# Patient Record
Sex: Male | Born: 1945
Health system: Southern US, Community
[De-identification: ages and names within clinical notes are randomized; demographics above are authoritative.]

## PROBLEM LIST (undated history)

## (undated) DIAGNOSIS — D170 Benign lipomatous neoplasm of skin and subcutaneous tissue of head, face and neck: Secondary | ICD-10-CM

## (undated) DIAGNOSIS — J449 Chronic obstructive pulmonary disease, unspecified: Secondary | ICD-10-CM

## (undated) DIAGNOSIS — C61 Malignant neoplasm of prostate: Secondary | ICD-10-CM

## (undated) DIAGNOSIS — Z87891 Personal history of nicotine dependence: Secondary | ICD-10-CM

## (undated) DIAGNOSIS — I2699 Other pulmonary embolism without acute cor pulmonale: Secondary | ICD-10-CM

## (undated) DIAGNOSIS — D509 Iron deficiency anemia, unspecified: Secondary | ICD-10-CM

## (undated) DIAGNOSIS — J9611 Chronic respiratory failure with hypoxia: Secondary | ICD-10-CM

## (undated) DIAGNOSIS — J302 Other seasonal allergic rhinitis: Secondary | ICD-10-CM

## (undated) DIAGNOSIS — K219 Gastro-esophageal reflux disease without esophagitis: Secondary | ICD-10-CM

## (undated) HISTORY — PX: CATARACT EXTRACTION: SUR2

---

## 2011-08-02 DIAGNOSIS — R339 Retention of urine, unspecified: Secondary | ICD-10-CM | POA: Diagnosis not present

## 2011-08-02 DIAGNOSIS — R0602 Shortness of breath: Secondary | ICD-10-CM | POA: Diagnosis not present

## 2011-08-02 DIAGNOSIS — N10 Acute tubulo-interstitial nephritis: Secondary | ICD-10-CM | POA: Diagnosis not present

## 2011-08-02 DIAGNOSIS — J441 Chronic obstructive pulmonary disease with (acute) exacerbation: Secondary | ICD-10-CM | POA: Diagnosis not present

## 2011-08-02 DIAGNOSIS — R062 Wheezing: Secondary | ICD-10-CM | POA: Diagnosis not present

## 2011-08-24 DIAGNOSIS — F17200 Nicotine dependence, unspecified, uncomplicated: Secondary | ICD-10-CM | POA: Diagnosis not present

## 2011-08-24 DIAGNOSIS — I059 Rheumatic mitral valve disease, unspecified: Secondary | ICD-10-CM | POA: Diagnosis not present

## 2011-08-24 DIAGNOSIS — R0989 Other specified symptoms and signs involving the circulatory and respiratory systems: Secondary | ICD-10-CM | POA: Diagnosis not present

## 2011-08-24 DIAGNOSIS — R0602 Shortness of breath: Secondary | ICD-10-CM | POA: Diagnosis not present

## 2011-08-24 DIAGNOSIS — J9600 Acute respiratory failure, unspecified whether with hypoxia or hypercapnia: Secondary | ICD-10-CM | POA: Diagnosis not present

## 2011-08-24 DIAGNOSIS — I2699 Other pulmonary embolism without acute cor pulmonale: Secondary | ICD-10-CM | POA: Diagnosis not present

## 2011-08-24 DIAGNOSIS — Z9981 Dependence on supplemental oxygen: Secondary | ICD-10-CM | POA: Diagnosis not present

## 2011-08-24 DIAGNOSIS — R000 Tachycardia, unspecified: Secondary | ICD-10-CM | POA: Diagnosis not present

## 2011-08-24 DIAGNOSIS — R0902 Hypoxemia: Secondary | ICD-10-CM | POA: Diagnosis not present

## 2011-08-24 DIAGNOSIS — J441 Chronic obstructive pulmonary disease with (acute) exacerbation: Secondary | ICD-10-CM | POA: Diagnosis not present

## 2011-08-24 DIAGNOSIS — R05 Cough: Secondary | ICD-10-CM | POA: Diagnosis not present

## 2011-08-24 DIAGNOSIS — Z8546 Personal history of malignant neoplasm of prostate: Secondary | ICD-10-CM | POA: Diagnosis not present

## 2011-08-25 DIAGNOSIS — I059 Rheumatic mitral valve disease, unspecified: Secondary | ICD-10-CM | POA: Diagnosis not present

## 2011-09-09 DIAGNOSIS — R634 Abnormal weight loss: Secondary | ICD-10-CM | POA: Diagnosis not present

## 2011-09-09 DIAGNOSIS — N390 Urinary tract infection, site not specified: Secondary | ICD-10-CM | POA: Diagnosis not present

## 2011-09-09 DIAGNOSIS — J438 Other emphysema: Secondary | ICD-10-CM | POA: Diagnosis not present

## 2011-09-09 DIAGNOSIS — F17200 Nicotine dependence, unspecified, uncomplicated: Secondary | ICD-10-CM | POA: Diagnosis not present

## 2011-09-09 DIAGNOSIS — R5381 Other malaise: Secondary | ICD-10-CM | POA: Diagnosis not present

## 2011-09-09 DIAGNOSIS — R5383 Other fatigue: Secondary | ICD-10-CM | POA: Diagnosis not present

## 2011-09-29 DIAGNOSIS — H2510 Age-related nuclear cataract, unspecified eye: Secondary | ICD-10-CM | POA: Diagnosis not present

## 2011-10-06 DIAGNOSIS — Z87891 Personal history of nicotine dependence: Secondary | ICD-10-CM | POA: Diagnosis not present

## 2011-10-06 DIAGNOSIS — H269 Unspecified cataract: Secondary | ICD-10-CM | POA: Diagnosis not present

## 2011-10-06 DIAGNOSIS — H2510 Age-related nuclear cataract, unspecified eye: Secondary | ICD-10-CM | POA: Diagnosis not present

## 2011-10-06 DIAGNOSIS — J449 Chronic obstructive pulmonary disease, unspecified: Secondary | ICD-10-CM | POA: Diagnosis not present

## 2011-10-06 DIAGNOSIS — Z8546 Personal history of malignant neoplasm of prostate: Secondary | ICD-10-CM | POA: Diagnosis not present

## 2011-10-09 DIAGNOSIS — J449 Chronic obstructive pulmonary disease, unspecified: Secondary | ICD-10-CM | POA: Diagnosis not present

## 2011-12-01 DIAGNOSIS — H2510 Age-related nuclear cataract, unspecified eye: Secondary | ICD-10-CM | POA: Diagnosis not present

## 2011-12-03 DIAGNOSIS — F17200 Nicotine dependence, unspecified, uncomplicated: Secondary | ICD-10-CM | POA: Diagnosis not present

## 2011-12-03 DIAGNOSIS — R5381 Other malaise: Secondary | ICD-10-CM | POA: Diagnosis not present

## 2011-12-03 DIAGNOSIS — R5383 Other fatigue: Secondary | ICD-10-CM | POA: Diagnosis not present

## 2011-12-03 DIAGNOSIS — C61 Malignant neoplasm of prostate: Secondary | ICD-10-CM | POA: Diagnosis not present

## 2011-12-03 DIAGNOSIS — N390 Urinary tract infection, site not specified: Secondary | ICD-10-CM | POA: Diagnosis not present

## 2011-12-03 DIAGNOSIS — R634 Abnormal weight loss: Secondary | ICD-10-CM | POA: Diagnosis not present

## 2011-12-03 DIAGNOSIS — J438 Other emphysema: Secondary | ICD-10-CM | POA: Diagnosis not present

## 2011-12-08 DIAGNOSIS — Z8546 Personal history of malignant neoplasm of prostate: Secondary | ICD-10-CM | POA: Diagnosis not present

## 2011-12-08 DIAGNOSIS — H269 Unspecified cataract: Secondary | ICD-10-CM | POA: Diagnosis not present

## 2011-12-08 DIAGNOSIS — J449 Chronic obstructive pulmonary disease, unspecified: Secondary | ICD-10-CM | POA: Diagnosis not present

## 2011-12-08 DIAGNOSIS — H2510 Age-related nuclear cataract, unspecified eye: Secondary | ICD-10-CM | POA: Diagnosis not present

## 2011-12-22 DIAGNOSIS — R300 Dysuria: Secondary | ICD-10-CM | POA: Diagnosis not present

## 2011-12-22 DIAGNOSIS — N390 Urinary tract infection, site not specified: Secondary | ICD-10-CM | POA: Diagnosis not present

## 2012-01-11 DIAGNOSIS — N401 Enlarged prostate with lower urinary tract symptoms: Secondary | ICD-10-CM | POA: Diagnosis not present

## 2012-01-11 DIAGNOSIS — R3129 Other microscopic hematuria: Secondary | ICD-10-CM | POA: Diagnosis not present

## 2012-01-11 DIAGNOSIS — N390 Urinary tract infection, site not specified: Secondary | ICD-10-CM | POA: Diagnosis not present

## 2012-01-11 DIAGNOSIS — R39198 Other difficulties with micturition: Secondary | ICD-10-CM | POA: Diagnosis not present

## 2012-01-28 DIAGNOSIS — N401 Enlarged prostate with lower urinary tract symptoms: Secondary | ICD-10-CM | POA: Diagnosis not present

## 2012-01-28 DIAGNOSIS — R3129 Other microscopic hematuria: Secondary | ICD-10-CM | POA: Diagnosis not present

## 2012-03-01 DIAGNOSIS — E782 Mixed hyperlipidemia: Secondary | ICD-10-CM | POA: Diagnosis not present

## 2012-03-01 DIAGNOSIS — E7800 Pure hypercholesterolemia, unspecified: Secondary | ICD-10-CM | POA: Diagnosis not present

## 2012-03-01 DIAGNOSIS — Z0000 Encounter for general adult medical examination without abnormal findings: Secondary | ICD-10-CM | POA: Diagnosis not present

## 2012-03-01 DIAGNOSIS — E559 Vitamin D deficiency, unspecified: Secondary | ICD-10-CM | POA: Diagnosis not present

## 2012-03-01 DIAGNOSIS — R5381 Other malaise: Secondary | ICD-10-CM | POA: Diagnosis not present

## 2012-03-08 DIAGNOSIS — J438 Other emphysema: Secondary | ICD-10-CM | POA: Diagnosis not present

## 2012-03-08 DIAGNOSIS — R000 Tachycardia, unspecified: Secondary | ICD-10-CM | POA: Diagnosis not present

## 2012-03-08 DIAGNOSIS — R3129 Other microscopic hematuria: Secondary | ICD-10-CM | POA: Diagnosis not present

## 2012-03-08 DIAGNOSIS — C61 Malignant neoplasm of prostate: Secondary | ICD-10-CM | POA: Diagnosis not present

## 2012-03-08 DIAGNOSIS — Z0000 Encounter for general adult medical examination without abnormal findings: Secondary | ICD-10-CM | POA: Diagnosis not present

## 2012-03-08 DIAGNOSIS — Z23 Encounter for immunization: Secondary | ICD-10-CM | POA: Diagnosis not present

## 2012-03-10 DIAGNOSIS — J449 Chronic obstructive pulmonary disease, unspecified: Secondary | ICD-10-CM | POA: Diagnosis not present

## 2012-03-10 DIAGNOSIS — N329 Bladder disorder, unspecified: Secondary | ICD-10-CM | POA: Diagnosis not present

## 2012-03-10 DIAGNOSIS — N401 Enlarged prostate with lower urinary tract symptoms: Secondary | ICD-10-CM | POA: Diagnosis not present

## 2012-03-10 DIAGNOSIS — R351 Nocturia: Secondary | ICD-10-CM | POA: Diagnosis not present

## 2012-04-08 DIAGNOSIS — J449 Chronic obstructive pulmonary disease, unspecified: Secondary | ICD-10-CM | POA: Diagnosis not present

## 2012-04-15 DIAGNOSIS — N390 Urinary tract infection, site not specified: Secondary | ICD-10-CM | POA: Diagnosis not present

## 2012-04-15 DIAGNOSIS — R339 Retention of urine, unspecified: Secondary | ICD-10-CM | POA: Diagnosis not present

## 2012-04-15 DIAGNOSIS — C61 Malignant neoplasm of prostate: Secondary | ICD-10-CM | POA: Diagnosis not present

## 2012-04-29 DIAGNOSIS — C61 Malignant neoplasm of prostate: Secondary | ICD-10-CM | POA: Diagnosis not present

## 2012-04-29 DIAGNOSIS — N401 Enlarged prostate with lower urinary tract symptoms: Secondary | ICD-10-CM | POA: Diagnosis not present

## 2012-04-29 DIAGNOSIS — N390 Urinary tract infection, site not specified: Secondary | ICD-10-CM | POA: Diagnosis not present

## 2012-06-01 DIAGNOSIS — C44319 Basal cell carcinoma of skin of other parts of face: Secondary | ICD-10-CM | POA: Diagnosis not present

## 2012-06-01 DIAGNOSIS — L570 Actinic keratosis: Secondary | ICD-10-CM | POA: Diagnosis not present

## 2012-06-01 DIAGNOSIS — Z789 Other specified health status: Secondary | ICD-10-CM | POA: Diagnosis not present

## 2012-06-01 DIAGNOSIS — D234 Other benign neoplasm of skin of scalp and neck: Secondary | ICD-10-CM | POA: Diagnosis not present

## 2012-07-14 DIAGNOSIS — Z85828 Personal history of other malignant neoplasm of skin: Secondary | ICD-10-CM | POA: Diagnosis not present

## 2012-07-14 DIAGNOSIS — L570 Actinic keratosis: Secondary | ICD-10-CM | POA: Diagnosis not present

## 2012-08-02 DIAGNOSIS — Z961 Presence of intraocular lens: Secondary | ICD-10-CM | POA: Diagnosis not present

## 2012-08-09 DIAGNOSIS — C61 Malignant neoplasm of prostate: Secondary | ICD-10-CM | POA: Diagnosis not present

## 2012-08-09 DIAGNOSIS — R350 Frequency of micturition: Secondary | ICD-10-CM | POA: Diagnosis not present

## 2012-08-11 DIAGNOSIS — C61 Malignant neoplasm of prostate: Secondary | ICD-10-CM | POA: Diagnosis not present

## 2012-08-30 DIAGNOSIS — E559 Vitamin D deficiency, unspecified: Secondary | ICD-10-CM | POA: Diagnosis not present

## 2012-08-30 DIAGNOSIS — E7800 Pure hypercholesterolemia, unspecified: Secondary | ICD-10-CM | POA: Diagnosis not present

## 2012-08-30 DIAGNOSIS — F17200 Nicotine dependence, unspecified, uncomplicated: Secondary | ICD-10-CM | POA: Diagnosis not present

## 2012-08-30 DIAGNOSIS — R5381 Other malaise: Secondary | ICD-10-CM | POA: Diagnosis not present

## 2012-09-05 DIAGNOSIS — R339 Retention of urine, unspecified: Secondary | ICD-10-CM | POA: Diagnosis not present

## 2012-09-05 DIAGNOSIS — N401 Enlarged prostate with lower urinary tract symptoms: Secondary | ICD-10-CM | POA: Diagnosis not present

## 2012-09-05 DIAGNOSIS — C61 Malignant neoplasm of prostate: Secondary | ICD-10-CM | POA: Diagnosis not present

## 2012-09-05 DIAGNOSIS — R3129 Other microscopic hematuria: Secondary | ICD-10-CM | POA: Diagnosis not present

## 2012-09-06 DIAGNOSIS — R220 Localized swelling, mass and lump, head: Secondary | ICD-10-CM | POA: Diagnosis not present

## 2012-09-06 DIAGNOSIS — E559 Vitamin D deficiency, unspecified: Secondary | ICD-10-CM | POA: Diagnosis not present

## 2012-09-06 DIAGNOSIS — J438 Other emphysema: Secondary | ICD-10-CM | POA: Diagnosis not present

## 2012-09-06 DIAGNOSIS — R000 Tachycardia, unspecified: Secondary | ICD-10-CM | POA: Diagnosis not present

## 2012-09-06 DIAGNOSIS — C4491 Basal cell carcinoma of skin, unspecified: Secondary | ICD-10-CM | POA: Diagnosis not present

## 2012-09-06 DIAGNOSIS — C61 Malignant neoplasm of prostate: Secondary | ICD-10-CM | POA: Diagnosis not present

## 2012-09-06 DIAGNOSIS — R221 Localized swelling, mass and lump, neck: Secondary | ICD-10-CM | POA: Diagnosis not present

## 2012-09-06 DIAGNOSIS — R3129 Other microscopic hematuria: Secondary | ICD-10-CM | POA: Diagnosis not present

## 2012-10-13 DIAGNOSIS — L570 Actinic keratosis: Secondary | ICD-10-CM | POA: Diagnosis not present

## 2012-10-13 DIAGNOSIS — Z85828 Personal history of other malignant neoplasm of skin: Secondary | ICD-10-CM | POA: Diagnosis not present

## 2013-01-17 DIAGNOSIS — Z85828 Personal history of other malignant neoplasm of skin: Secondary | ICD-10-CM | POA: Diagnosis not present

## 2013-01-17 DIAGNOSIS — L570 Actinic keratosis: Secondary | ICD-10-CM | POA: Diagnosis not present

## 2013-02-07 DIAGNOSIS — C61 Malignant neoplasm of prostate: Secondary | ICD-10-CM | POA: Diagnosis not present

## 2013-02-09 DIAGNOSIS — C61 Malignant neoplasm of prostate: Secondary | ICD-10-CM | POA: Diagnosis not present

## 2013-02-17 DIAGNOSIS — J449 Chronic obstructive pulmonary disease, unspecified: Secondary | ICD-10-CM | POA: Diagnosis not present

## 2013-03-07 DIAGNOSIS — E559 Vitamin D deficiency, unspecified: Secondary | ICD-10-CM | POA: Diagnosis not present

## 2013-03-07 DIAGNOSIS — R634 Abnormal weight loss: Secondary | ICD-10-CM | POA: Diagnosis not present

## 2013-03-07 DIAGNOSIS — F17200 Nicotine dependence, unspecified, uncomplicated: Secondary | ICD-10-CM | POA: Diagnosis not present

## 2013-03-07 DIAGNOSIS — R5381 Other malaise: Secondary | ICD-10-CM | POA: Diagnosis not present

## 2013-03-07 DIAGNOSIS — J438 Other emphysema: Secondary | ICD-10-CM | POA: Diagnosis not present

## 2013-03-07 DIAGNOSIS — E7800 Pure hypercholesterolemia, unspecified: Secondary | ICD-10-CM | POA: Diagnosis not present

## 2013-03-14 DIAGNOSIS — Z0000 Encounter for general adult medical examination without abnormal findings: Secondary | ICD-10-CM | POA: Diagnosis not present

## 2013-03-14 DIAGNOSIS — R3129 Other microscopic hematuria: Secondary | ICD-10-CM | POA: Diagnosis not present

## 2013-03-14 DIAGNOSIS — R000 Tachycardia, unspecified: Secondary | ICD-10-CM | POA: Diagnosis not present

## 2013-03-14 DIAGNOSIS — J438 Other emphysema: Secondary | ICD-10-CM | POA: Diagnosis not present

## 2013-03-14 DIAGNOSIS — Z23 Encounter for immunization: Secondary | ICD-10-CM | POA: Diagnosis not present

## 2013-03-14 DIAGNOSIS — Z1331 Encounter for screening for depression: Secondary | ICD-10-CM | POA: Diagnosis not present

## 2013-03-14 DIAGNOSIS — E559 Vitamin D deficiency, unspecified: Secondary | ICD-10-CM | POA: Diagnosis not present

## 2013-03-14 DIAGNOSIS — C61 Malignant neoplasm of prostate: Secondary | ICD-10-CM | POA: Diagnosis not present

## 2013-04-04 DIAGNOSIS — C61 Malignant neoplasm of prostate: Secondary | ICD-10-CM | POA: Diagnosis not present

## 2013-04-19 DIAGNOSIS — L723 Sebaceous cyst: Secondary | ICD-10-CM | POA: Diagnosis not present

## 2013-04-19 DIAGNOSIS — L570 Actinic keratosis: Secondary | ICD-10-CM | POA: Diagnosis not present

## 2013-04-19 DIAGNOSIS — Z85828 Personal history of other malignant neoplasm of skin: Secondary | ICD-10-CM | POA: Diagnosis not present

## 2013-05-08 DIAGNOSIS — N401 Enlarged prostate with lower urinary tract symptoms: Secondary | ICD-10-CM | POA: Diagnosis not present

## 2013-05-08 DIAGNOSIS — R351 Nocturia: Secondary | ICD-10-CM | POA: Diagnosis not present

## 2013-05-08 DIAGNOSIS — C61 Malignant neoplasm of prostate: Secondary | ICD-10-CM | POA: Diagnosis not present

## 2013-05-08 DIAGNOSIS — R339 Retention of urine, unspecified: Secondary | ICD-10-CM | POA: Diagnosis not present

## 2013-05-08 DIAGNOSIS — R3915 Urgency of urination: Secondary | ICD-10-CM | POA: Diagnosis not present

## 2013-06-07 DIAGNOSIS — C61 Malignant neoplasm of prostate: Secondary | ICD-10-CM | POA: Diagnosis not present

## 2013-06-09 DIAGNOSIS — R351 Nocturia: Secondary | ICD-10-CM | POA: Diagnosis not present

## 2013-06-09 DIAGNOSIS — N401 Enlarged prostate with lower urinary tract symptoms: Secondary | ICD-10-CM | POA: Diagnosis not present

## 2013-06-09 DIAGNOSIS — R339 Retention of urine, unspecified: Secondary | ICD-10-CM | POA: Diagnosis not present

## 2013-06-09 DIAGNOSIS — C61 Malignant neoplasm of prostate: Secondary | ICD-10-CM | POA: Diagnosis not present

## 2013-06-09 DIAGNOSIS — N138 Other obstructive and reflux uropathy: Secondary | ICD-10-CM | POA: Diagnosis not present

## 2013-06-12 DIAGNOSIS — R351 Nocturia: Secondary | ICD-10-CM | POA: Diagnosis not present

## 2013-06-12 DIAGNOSIS — R339 Retention of urine, unspecified: Secondary | ICD-10-CM | POA: Diagnosis not present

## 2013-06-12 DIAGNOSIS — Z883 Allergy status to other anti-infective agents status: Secondary | ICD-10-CM | POA: Diagnosis not present

## 2013-06-12 DIAGNOSIS — Z87440 Personal history of urinary (tract) infections: Secondary | ICD-10-CM | POA: Diagnosis not present

## 2013-06-12 DIAGNOSIS — N401 Enlarged prostate with lower urinary tract symptoms: Secondary | ICD-10-CM | POA: Diagnosis not present

## 2013-06-12 DIAGNOSIS — Z01818 Encounter for other preprocedural examination: Secondary | ICD-10-CM | POA: Diagnosis not present

## 2013-06-12 DIAGNOSIS — K649 Unspecified hemorrhoids: Secondary | ICD-10-CM | POA: Diagnosis not present

## 2013-06-12 DIAGNOSIS — C61 Malignant neoplasm of prostate: Secondary | ICD-10-CM | POA: Diagnosis not present

## 2013-06-12 DIAGNOSIS — Z808 Family history of malignant neoplasm of other organs or systems: Secondary | ICD-10-CM | POA: Diagnosis not present

## 2013-06-12 DIAGNOSIS — R3915 Urgency of urination: Secondary | ICD-10-CM | POA: Diagnosis not present

## 2013-06-12 DIAGNOSIS — R3129 Other microscopic hematuria: Secondary | ICD-10-CM | POA: Diagnosis not present

## 2013-06-12 DIAGNOSIS — Z01810 Encounter for preprocedural cardiovascular examination: Secondary | ICD-10-CM | POA: Diagnosis not present

## 2013-06-12 DIAGNOSIS — J449 Chronic obstructive pulmonary disease, unspecified: Secondary | ICD-10-CM | POA: Diagnosis not present

## 2013-06-12 DIAGNOSIS — Z01812 Encounter for preprocedural laboratory examination: Secondary | ICD-10-CM | POA: Diagnosis not present

## 2013-06-14 DIAGNOSIS — R3129 Other microscopic hematuria: Secondary | ICD-10-CM | POA: Diagnosis not present

## 2013-06-14 DIAGNOSIS — N401 Enlarged prostate with lower urinary tract symptoms: Secondary | ICD-10-CM | POA: Diagnosis not present

## 2013-06-14 DIAGNOSIS — R339 Retention of urine, unspecified: Secondary | ICD-10-CM | POA: Diagnosis not present

## 2013-06-14 DIAGNOSIS — R351 Nocturia: Secondary | ICD-10-CM | POA: Diagnosis not present

## 2013-06-14 DIAGNOSIS — C61 Malignant neoplasm of prostate: Secondary | ICD-10-CM | POA: Diagnosis not present

## 2013-06-15 DIAGNOSIS — N401 Enlarged prostate with lower urinary tract symptoms: Secondary | ICD-10-CM | POA: Diagnosis not present

## 2013-06-15 DIAGNOSIS — C61 Malignant neoplasm of prostate: Secondary | ICD-10-CM | POA: Diagnosis not present

## 2013-06-15 DIAGNOSIS — R339 Retention of urine, unspecified: Secondary | ICD-10-CM | POA: Diagnosis not present

## 2013-06-26 DIAGNOSIS — R300 Dysuria: Secondary | ICD-10-CM | POA: Diagnosis not present

## 2013-06-26 DIAGNOSIS — N138 Other obstructive and reflux uropathy: Secondary | ICD-10-CM | POA: Diagnosis not present

## 2013-06-26 DIAGNOSIS — N401 Enlarged prostate with lower urinary tract symptoms: Secondary | ICD-10-CM | POA: Diagnosis not present

## 2013-06-26 DIAGNOSIS — Z87440 Personal history of urinary (tract) infections: Secondary | ICD-10-CM | POA: Diagnosis not present

## 2013-06-26 DIAGNOSIS — C61 Malignant neoplasm of prostate: Secondary | ICD-10-CM | POA: Diagnosis not present

## 2013-06-26 DIAGNOSIS — R351 Nocturia: Secondary | ICD-10-CM | POA: Diagnosis not present

## 2013-06-26 DIAGNOSIS — R3129 Other microscopic hematuria: Secondary | ICD-10-CM | POA: Diagnosis not present

## 2013-06-27 DIAGNOSIS — R3129 Other microscopic hematuria: Secondary | ICD-10-CM | POA: Diagnosis not present

## 2013-06-27 DIAGNOSIS — N401 Enlarged prostate with lower urinary tract symptoms: Secondary | ICD-10-CM | POA: Diagnosis not present

## 2013-06-28 DIAGNOSIS — R351 Nocturia: Secondary | ICD-10-CM | POA: Diagnosis not present

## 2013-06-28 DIAGNOSIS — R339 Retention of urine, unspecified: Secondary | ICD-10-CM | POA: Diagnosis not present

## 2013-06-28 DIAGNOSIS — C61 Malignant neoplasm of prostate: Secondary | ICD-10-CM | POA: Diagnosis not present

## 2013-06-28 DIAGNOSIS — R300 Dysuria: Secondary | ICD-10-CM | POA: Diagnosis not present

## 2013-06-28 DIAGNOSIS — N390 Urinary tract infection, site not specified: Secondary | ICD-10-CM | POA: Diagnosis not present

## 2013-06-28 DIAGNOSIS — N401 Enlarged prostate with lower urinary tract symptoms: Secondary | ICD-10-CM | POA: Diagnosis not present

## 2013-06-29 DIAGNOSIS — N390 Urinary tract infection, site not specified: Secondary | ICD-10-CM | POA: Diagnosis not present

## 2013-06-29 DIAGNOSIS — R300 Dysuria: Secondary | ICD-10-CM | POA: Diagnosis not present

## 2013-06-29 DIAGNOSIS — C61 Malignant neoplasm of prostate: Secondary | ICD-10-CM | POA: Diagnosis not present

## 2013-06-29 DIAGNOSIS — R339 Retention of urine, unspecified: Secondary | ICD-10-CM | POA: Diagnosis not present

## 2013-07-03 DIAGNOSIS — N401 Enlarged prostate with lower urinary tract symptoms: Secondary | ICD-10-CM | POA: Diagnosis not present

## 2013-07-03 DIAGNOSIS — R339 Retention of urine, unspecified: Secondary | ICD-10-CM | POA: Diagnosis not present

## 2013-07-03 DIAGNOSIS — C61 Malignant neoplasm of prostate: Secondary | ICD-10-CM | POA: Diagnosis not present

## 2013-07-03 DIAGNOSIS — N390 Urinary tract infection, site not specified: Secondary | ICD-10-CM | POA: Diagnosis not present

## 2013-07-05 DIAGNOSIS — N138 Other obstructive and reflux uropathy: Secondary | ICD-10-CM | POA: Diagnosis not present

## 2013-07-05 DIAGNOSIS — N401 Enlarged prostate with lower urinary tract symptoms: Secondary | ICD-10-CM | POA: Diagnosis not present

## 2013-07-05 DIAGNOSIS — N390 Urinary tract infection, site not specified: Secondary | ICD-10-CM | POA: Diagnosis not present

## 2013-07-05 DIAGNOSIS — R3129 Other microscopic hematuria: Secondary | ICD-10-CM | POA: Diagnosis not present

## 2013-07-12 DIAGNOSIS — R339 Retention of urine, unspecified: Secondary | ICD-10-CM | POA: Diagnosis not present

## 2013-07-12 DIAGNOSIS — C61 Malignant neoplasm of prostate: Secondary | ICD-10-CM | POA: Diagnosis not present

## 2013-07-12 DIAGNOSIS — N401 Enlarged prostate with lower urinary tract symptoms: Secondary | ICD-10-CM | POA: Diagnosis not present

## 2013-07-12 DIAGNOSIS — R3911 Hesitancy of micturition: Secondary | ICD-10-CM | POA: Diagnosis not present

## 2013-07-12 DIAGNOSIS — R39198 Other difficulties with micturition: Secondary | ICD-10-CM | POA: Diagnosis not present

## 2013-07-19 DIAGNOSIS — R300 Dysuria: Secondary | ICD-10-CM | POA: Diagnosis not present

## 2013-07-19 DIAGNOSIS — Z85828 Personal history of other malignant neoplasm of skin: Secondary | ICD-10-CM | POA: Diagnosis not present

## 2013-07-19 DIAGNOSIS — L570 Actinic keratosis: Secondary | ICD-10-CM | POA: Diagnosis not present

## 2013-07-19 DIAGNOSIS — N401 Enlarged prostate with lower urinary tract symptoms: Secondary | ICD-10-CM | POA: Diagnosis not present

## 2013-07-19 DIAGNOSIS — N138 Other obstructive and reflux uropathy: Secondary | ICD-10-CM | POA: Diagnosis not present

## 2013-07-28 DIAGNOSIS — C61 Malignant neoplasm of prostate: Secondary | ICD-10-CM | POA: Diagnosis not present

## 2013-08-09 DIAGNOSIS — R339 Retention of urine, unspecified: Secondary | ICD-10-CM | POA: Diagnosis not present

## 2013-08-09 DIAGNOSIS — R351 Nocturia: Secondary | ICD-10-CM | POA: Diagnosis not present

## 2013-08-09 DIAGNOSIS — R3129 Other microscopic hematuria: Secondary | ICD-10-CM | POA: Diagnosis not present

## 2013-08-09 DIAGNOSIS — N401 Enlarged prostate with lower urinary tract symptoms: Secondary | ICD-10-CM | POA: Diagnosis not present

## 2013-08-09 DIAGNOSIS — N138 Other obstructive and reflux uropathy: Secondary | ICD-10-CM | POA: Diagnosis not present

## 2013-08-23 DIAGNOSIS — R339 Retention of urine, unspecified: Secondary | ICD-10-CM | POA: Diagnosis not present

## 2013-08-23 DIAGNOSIS — R3129 Other microscopic hematuria: Secondary | ICD-10-CM | POA: Diagnosis not present

## 2013-08-23 DIAGNOSIS — N401 Enlarged prostate with lower urinary tract symptoms: Secondary | ICD-10-CM | POA: Diagnosis not present

## 2013-09-04 DIAGNOSIS — E559 Vitamin D deficiency, unspecified: Secondary | ICD-10-CM | POA: Diagnosis not present

## 2013-09-04 DIAGNOSIS — R634 Abnormal weight loss: Secondary | ICD-10-CM | POA: Diagnosis not present

## 2013-09-04 DIAGNOSIS — E7800 Pure hypercholesterolemia, unspecified: Secondary | ICD-10-CM | POA: Diagnosis not present

## 2013-09-04 DIAGNOSIS — R5381 Other malaise: Secondary | ICD-10-CM | POA: Diagnosis not present

## 2013-09-04 DIAGNOSIS — R5383 Other fatigue: Secondary | ICD-10-CM | POA: Diagnosis not present

## 2013-09-04 DIAGNOSIS — R000 Tachycardia, unspecified: Secondary | ICD-10-CM | POA: Diagnosis not present

## 2013-09-04 DIAGNOSIS — F17200 Nicotine dependence, unspecified, uncomplicated: Secondary | ICD-10-CM | POA: Diagnosis not present

## 2013-09-04 DIAGNOSIS — C61 Malignant neoplasm of prostate: Secondary | ICD-10-CM | POA: Diagnosis not present

## 2013-09-11 DIAGNOSIS — C4491 Basal cell carcinoma of skin, unspecified: Secondary | ICD-10-CM | POA: Diagnosis not present

## 2013-09-11 DIAGNOSIS — J438 Other emphysema: Secondary | ICD-10-CM | POA: Diagnosis not present

## 2013-09-11 DIAGNOSIS — R3129 Other microscopic hematuria: Secondary | ICD-10-CM | POA: Diagnosis not present

## 2013-09-11 DIAGNOSIS — R000 Tachycardia, unspecified: Secondary | ICD-10-CM | POA: Diagnosis not present

## 2013-09-11 DIAGNOSIS — C61 Malignant neoplasm of prostate: Secondary | ICD-10-CM | POA: Diagnosis not present

## 2013-09-11 DIAGNOSIS — E559 Vitamin D deficiency, unspecified: Secondary | ICD-10-CM | POA: Diagnosis not present

## 2013-09-21 DIAGNOSIS — J449 Chronic obstructive pulmonary disease, unspecified: Secondary | ICD-10-CM | POA: Diagnosis not present

## 2013-10-04 DIAGNOSIS — N401 Enlarged prostate with lower urinary tract symptoms: Secondary | ICD-10-CM | POA: Diagnosis not present

## 2013-10-04 DIAGNOSIS — C61 Malignant neoplasm of prostate: Secondary | ICD-10-CM | POA: Diagnosis not present

## 2013-10-04 DIAGNOSIS — R82998 Other abnormal findings in urine: Secondary | ICD-10-CM | POA: Diagnosis not present

## 2013-10-04 DIAGNOSIS — R351 Nocturia: Secondary | ICD-10-CM | POA: Diagnosis not present

## 2013-10-04 DIAGNOSIS — R339 Retention of urine, unspecified: Secondary | ICD-10-CM | POA: Diagnosis not present

## 2013-10-04 DIAGNOSIS — Z87440 Personal history of urinary (tract) infections: Secondary | ICD-10-CM | POA: Diagnosis not present

## 2013-10-11 DIAGNOSIS — Z85828 Personal history of other malignant neoplasm of skin: Secondary | ICD-10-CM | POA: Diagnosis not present

## 2013-10-11 DIAGNOSIS — D2330 Other benign neoplasm of skin of unspecified part of face: Secondary | ICD-10-CM | POA: Diagnosis not present

## 2013-10-11 DIAGNOSIS — D235 Other benign neoplasm of skin of trunk: Secondary | ICD-10-CM | POA: Diagnosis not present

## 2013-10-11 DIAGNOSIS — Z789 Other specified health status: Secondary | ICD-10-CM | POA: Diagnosis not present

## 2013-10-11 DIAGNOSIS — L570 Actinic keratosis: Secondary | ICD-10-CM | POA: Diagnosis not present

## 2013-11-15 DIAGNOSIS — R39198 Other difficulties with micturition: Secondary | ICD-10-CM | POA: Diagnosis not present

## 2013-11-15 DIAGNOSIS — R339 Retention of urine, unspecified: Secondary | ICD-10-CM | POA: Diagnosis not present

## 2013-11-15 DIAGNOSIS — Z8546 Personal history of malignant neoplasm of prostate: Secondary | ICD-10-CM | POA: Diagnosis not present

## 2013-11-15 DIAGNOSIS — Z87440 Personal history of urinary (tract) infections: Secondary | ICD-10-CM | POA: Diagnosis not present

## 2013-11-15 DIAGNOSIS — N401 Enlarged prostate with lower urinary tract symptoms: Secondary | ICD-10-CM | POA: Diagnosis not present

## 2014-01-15 DIAGNOSIS — Z85828 Personal history of other malignant neoplasm of skin: Secondary | ICD-10-CM | POA: Diagnosis not present

## 2014-02-06 DIAGNOSIS — C61 Malignant neoplasm of prostate: Secondary | ICD-10-CM | POA: Diagnosis not present

## 2014-03-08 DIAGNOSIS — E559 Vitamin D deficiency, unspecified: Secondary | ICD-10-CM | POA: Diagnosis not present

## 2014-03-08 DIAGNOSIS — E782 Mixed hyperlipidemia: Secondary | ICD-10-CM | POA: Diagnosis not present

## 2014-03-08 DIAGNOSIS — J439 Emphysema, unspecified: Secondary | ICD-10-CM | POA: Diagnosis not present

## 2014-03-08 DIAGNOSIS — R5382 Chronic fatigue, unspecified: Secondary | ICD-10-CM | POA: Diagnosis not present

## 2014-03-08 DIAGNOSIS — C61 Malignant neoplasm of prostate: Secondary | ICD-10-CM | POA: Diagnosis not present

## 2014-03-08 DIAGNOSIS — Z720 Tobacco use: Secondary | ICD-10-CM | POA: Diagnosis not present

## 2014-03-15 DIAGNOSIS — Z23 Encounter for immunization: Secondary | ICD-10-CM | POA: Diagnosis not present

## 2014-03-15 DIAGNOSIS — E559 Vitamin D deficiency, unspecified: Secondary | ICD-10-CM | POA: Diagnosis not present

## 2014-03-15 DIAGNOSIS — C4491 Basal cell carcinoma of skin, unspecified: Secondary | ICD-10-CM | POA: Diagnosis not present

## 2014-03-15 DIAGNOSIS — Z87891 Personal history of nicotine dependence: Secondary | ICD-10-CM | POA: Diagnosis not present

## 2014-03-15 DIAGNOSIS — C61 Malignant neoplasm of prostate: Secondary | ICD-10-CM | POA: Diagnosis not present

## 2014-03-15 DIAGNOSIS — J439 Emphysema, unspecified: Secondary | ICD-10-CM | POA: Diagnosis not present

## 2014-03-29 DIAGNOSIS — J449 Chronic obstructive pulmonary disease, unspecified: Secondary | ICD-10-CM | POA: Diagnosis not present

## 2014-06-20 DIAGNOSIS — J440 Chronic obstructive pulmonary disease with acute lower respiratory infection: Secondary | ICD-10-CM | POA: Diagnosis not present

## 2014-06-20 DIAGNOSIS — J439 Emphysema, unspecified: Secondary | ICD-10-CM | POA: Diagnosis not present

## 2014-06-20 DIAGNOSIS — F17211 Nicotine dependence, cigarettes, in remission: Secondary | ICD-10-CM | POA: Diagnosis not present

## 2014-07-16 DIAGNOSIS — Z85828 Personal history of other malignant neoplasm of skin: Secondary | ICD-10-CM | POA: Diagnosis not present

## 2014-07-16 DIAGNOSIS — L570 Actinic keratosis: Secondary | ICD-10-CM | POA: Diagnosis not present

## 2014-08-27 DIAGNOSIS — N318 Other neuromuscular dysfunction of bladder: Secondary | ICD-10-CM | POA: Diagnosis not present

## 2014-08-27 DIAGNOSIS — Z8546 Personal history of malignant neoplasm of prostate: Secondary | ICD-10-CM | POA: Diagnosis not present

## 2014-08-27 DIAGNOSIS — Z87440 Personal history of urinary (tract) infections: Secondary | ICD-10-CM | POA: Diagnosis not present

## 2014-08-27 DIAGNOSIS — N401 Enlarged prostate with lower urinary tract symptoms: Secondary | ICD-10-CM | POA: Diagnosis not present

## 2014-09-10 DIAGNOSIS — R5382 Chronic fatigue, unspecified: Secondary | ICD-10-CM | POA: Diagnosis not present

## 2014-09-10 DIAGNOSIS — Z8349 Family history of other endocrine, nutritional and metabolic diseases: Secondary | ICD-10-CM | POA: Diagnosis not present

## 2014-09-10 DIAGNOSIS — R634 Abnormal weight loss: Secondary | ICD-10-CM | POA: Diagnosis not present

## 2014-09-10 DIAGNOSIS — E784 Other hyperlipidemia: Secondary | ICD-10-CM | POA: Diagnosis not present

## 2014-09-10 DIAGNOSIS — J439 Emphysema, unspecified: Secondary | ICD-10-CM | POA: Diagnosis not present

## 2014-09-10 DIAGNOSIS — C61 Malignant neoplasm of prostate: Secondary | ICD-10-CM | POA: Diagnosis not present

## 2014-09-10 DIAGNOSIS — D519 Vitamin B12 deficiency anemia, unspecified: Secondary | ICD-10-CM | POA: Diagnosis not present

## 2014-09-10 DIAGNOSIS — Z720 Tobacco use: Secondary | ICD-10-CM | POA: Diagnosis not present

## 2014-09-17 DIAGNOSIS — C61 Malignant neoplasm of prostate: Secondary | ICD-10-CM | POA: Diagnosis not present

## 2014-09-17 DIAGNOSIS — D509 Iron deficiency anemia, unspecified: Secondary | ICD-10-CM | POA: Diagnosis not present

## 2014-09-17 DIAGNOSIS — R000 Tachycardia, unspecified: Secondary | ICD-10-CM | POA: Diagnosis not present

## 2014-09-17 DIAGNOSIS — Z1389 Encounter for screening for other disorder: Secondary | ICD-10-CM | POA: Diagnosis not present

## 2014-09-17 DIAGNOSIS — J439 Emphysema, unspecified: Secondary | ICD-10-CM | POA: Diagnosis not present

## 2014-09-17 DIAGNOSIS — R5382 Chronic fatigue, unspecified: Secondary | ICD-10-CM | POA: Diagnosis not present

## 2014-09-24 DIAGNOSIS — D509 Iron deficiency anemia, unspecified: Secondary | ICD-10-CM | POA: Diagnosis not present

## 2014-10-09 DIAGNOSIS — K639 Disease of intestine, unspecified: Secondary | ICD-10-CM | POA: Diagnosis not present

## 2014-10-09 DIAGNOSIS — K644 Residual hemorrhoidal skin tags: Secondary | ICD-10-CM | POA: Diagnosis not present

## 2014-10-09 DIAGNOSIS — Z87440 Personal history of urinary (tract) infections: Secondary | ICD-10-CM | POA: Diagnosis not present

## 2014-10-09 DIAGNOSIS — Z809 Family history of malignant neoplasm, unspecified: Secondary | ICD-10-CM | POA: Diagnosis not present

## 2014-10-09 DIAGNOSIS — Z87891 Personal history of nicotine dependence: Secondary | ICD-10-CM | POA: Diagnosis not present

## 2014-10-09 DIAGNOSIS — E559 Vitamin D deficiency, unspecified: Secondary | ICD-10-CM | POA: Diagnosis not present

## 2014-10-09 DIAGNOSIS — K2980 Duodenitis without bleeding: Secondary | ICD-10-CM | POA: Diagnosis not present

## 2014-10-09 DIAGNOSIS — Z85828 Personal history of other malignant neoplasm of skin: Secondary | ICD-10-CM | POA: Diagnosis not present

## 2014-10-09 DIAGNOSIS — K208 Other esophagitis: Secondary | ICD-10-CM | POA: Diagnosis not present

## 2014-10-09 DIAGNOSIS — Z8546 Personal history of malignant neoplasm of prostate: Secondary | ICD-10-CM | POA: Diagnosis not present

## 2014-10-09 DIAGNOSIS — Z823 Family history of stroke: Secondary | ICD-10-CM | POA: Diagnosis not present

## 2014-10-09 DIAGNOSIS — J449 Chronic obstructive pulmonary disease, unspecified: Secondary | ICD-10-CM | POA: Diagnosis not present

## 2014-10-09 DIAGNOSIS — D509 Iron deficiency anemia, unspecified: Secondary | ICD-10-CM | POA: Diagnosis not present

## 2014-10-09 DIAGNOSIS — Z7951 Long term (current) use of inhaled steroids: Secondary | ICD-10-CM | POA: Diagnosis not present

## 2014-10-09 DIAGNOSIS — Z79899 Other long term (current) drug therapy: Secondary | ICD-10-CM | POA: Diagnosis not present

## 2014-10-09 DIAGNOSIS — K598 Other specified functional intestinal disorders: Secondary | ICD-10-CM | POA: Diagnosis not present

## 2014-10-09 DIAGNOSIS — K259 Gastric ulcer, unspecified as acute or chronic, without hemorrhage or perforation: Secondary | ICD-10-CM | POA: Diagnosis not present

## 2014-10-11 DIAGNOSIS — J449 Chronic obstructive pulmonary disease, unspecified: Secondary | ICD-10-CM | POA: Diagnosis not present

## 2014-10-16 DIAGNOSIS — C61 Malignant neoplasm of prostate: Secondary | ICD-10-CM | POA: Diagnosis not present

## 2014-10-16 DIAGNOSIS — R5382 Chronic fatigue, unspecified: Secondary | ICD-10-CM | POA: Diagnosis not present

## 2014-10-16 DIAGNOSIS — R000 Tachycardia, unspecified: Secondary | ICD-10-CM | POA: Diagnosis not present

## 2014-10-16 DIAGNOSIS — D509 Iron deficiency anemia, unspecified: Secondary | ICD-10-CM | POA: Diagnosis not present

## 2014-10-16 DIAGNOSIS — J439 Emphysema, unspecified: Secondary | ICD-10-CM | POA: Diagnosis not present

## 2014-10-30 DIAGNOSIS — K2980 Duodenitis without bleeding: Secondary | ICD-10-CM | POA: Diagnosis not present

## 2014-10-30 DIAGNOSIS — D509 Iron deficiency anemia, unspecified: Secondary | ICD-10-CM | POA: Diagnosis not present

## 2014-10-30 DIAGNOSIS — R5382 Chronic fatigue, unspecified: Secondary | ICD-10-CM | POA: Diagnosis not present

## 2014-10-30 DIAGNOSIS — K209 Esophagitis, unspecified: Secondary | ICD-10-CM | POA: Diagnosis not present

## 2014-10-30 DIAGNOSIS — D518 Other vitamin B12 deficiency anemias: Secondary | ICD-10-CM | POA: Diagnosis not present

## 2014-12-03 DIAGNOSIS — Z8546 Personal history of malignant neoplasm of prostate: Secondary | ICD-10-CM | POA: Diagnosis not present

## 2014-12-03 DIAGNOSIS — C61 Malignant neoplasm of prostate: Secondary | ICD-10-CM | POA: Diagnosis not present

## 2014-12-03 DIAGNOSIS — Z87440 Personal history of urinary (tract) infections: Secondary | ICD-10-CM | POA: Diagnosis not present

## 2014-12-03 DIAGNOSIS — R3914 Feeling of incomplete bladder emptying: Secondary | ICD-10-CM | POA: Diagnosis not present

## 2014-12-03 DIAGNOSIS — N401 Enlarged prostate with lower urinary tract symptoms: Secondary | ICD-10-CM | POA: Diagnosis not present

## 2015-01-07 DIAGNOSIS — R5382 Chronic fatigue, unspecified: Secondary | ICD-10-CM | POA: Diagnosis not present

## 2015-01-07 DIAGNOSIS — C61 Malignant neoplasm of prostate: Secondary | ICD-10-CM | POA: Diagnosis not present

## 2015-01-07 DIAGNOSIS — E785 Hyperlipidemia, unspecified: Secondary | ICD-10-CM | POA: Diagnosis not present

## 2015-01-07 DIAGNOSIS — J439 Emphysema, unspecified: Secondary | ICD-10-CM | POA: Diagnosis not present

## 2015-01-07 DIAGNOSIS — E559 Vitamin D deficiency, unspecified: Secondary | ICD-10-CM | POA: Diagnosis not present

## 2015-01-07 DIAGNOSIS — Z720 Tobacco use: Secondary | ICD-10-CM | POA: Diagnosis not present

## 2015-01-07 DIAGNOSIS — D509 Iron deficiency anemia, unspecified: Secondary | ICD-10-CM | POA: Diagnosis not present

## 2015-01-07 DIAGNOSIS — E538 Deficiency of other specified B group vitamins: Secondary | ICD-10-CM | POA: Diagnosis not present

## 2015-01-14 DIAGNOSIS — Z85828 Personal history of other malignant neoplasm of skin: Secondary | ICD-10-CM | POA: Diagnosis not present

## 2015-01-14 DIAGNOSIS — D2239 Melanocytic nevi of other parts of face: Secondary | ICD-10-CM | POA: Diagnosis not present

## 2015-01-14 DIAGNOSIS — D2339 Other benign neoplasm of skin of other parts of face: Secondary | ICD-10-CM | POA: Diagnosis not present

## 2015-01-15 DIAGNOSIS — E559 Vitamin D deficiency, unspecified: Secondary | ICD-10-CM | POA: Diagnosis not present

## 2015-01-15 DIAGNOSIS — R5382 Chronic fatigue, unspecified: Secondary | ICD-10-CM | POA: Diagnosis not present

## 2015-01-15 DIAGNOSIS — R634 Abnormal weight loss: Secondary | ICD-10-CM | POA: Diagnosis not present

## 2015-01-15 DIAGNOSIS — D509 Iron deficiency anemia, unspecified: Secondary | ICD-10-CM | POA: Diagnosis not present

## 2015-01-15 DIAGNOSIS — R000 Tachycardia, unspecified: Secondary | ICD-10-CM | POA: Diagnosis not present

## 2015-01-15 DIAGNOSIS — C61 Malignant neoplasm of prostate: Secondary | ICD-10-CM | POA: Diagnosis not present

## 2015-01-15 DIAGNOSIS — J439 Emphysema, unspecified: Secondary | ICD-10-CM | POA: Diagnosis not present

## 2015-01-15 DIAGNOSIS — C4491 Basal cell carcinoma of skin, unspecified: Secondary | ICD-10-CM | POA: Diagnosis not present

## 2015-02-19 DIAGNOSIS — Z923 Personal history of irradiation: Secondary | ICD-10-CM | POA: Diagnosis not present

## 2015-02-19 DIAGNOSIS — Z79899 Other long term (current) drug therapy: Secondary | ICD-10-CM | POA: Diagnosis not present

## 2015-02-19 DIAGNOSIS — C61 Malignant neoplasm of prostate: Secondary | ICD-10-CM | POA: Diagnosis not present

## 2015-03-14 DIAGNOSIS — Z23 Encounter for immunization: Secondary | ICD-10-CM | POA: Diagnosis not present

## 2015-04-16 DIAGNOSIS — L570 Actinic keratosis: Secondary | ICD-10-CM | POA: Diagnosis not present

## 2015-04-16 DIAGNOSIS — Z85828 Personal history of other malignant neoplasm of skin: Secondary | ICD-10-CM | POA: Diagnosis not present

## 2015-04-17 DIAGNOSIS — J449 Chronic obstructive pulmonary disease, unspecified: Secondary | ICD-10-CM | POA: Diagnosis not present

## 2015-06-05 DIAGNOSIS — R351 Nocturia: Secondary | ICD-10-CM | POA: Diagnosis not present

## 2015-06-05 DIAGNOSIS — R350 Frequency of micturition: Secondary | ICD-10-CM | POA: Diagnosis not present

## 2015-06-05 DIAGNOSIS — N401 Enlarged prostate with lower urinary tract symptoms: Secondary | ICD-10-CM | POA: Diagnosis not present

## 2015-06-05 DIAGNOSIS — R3914 Feeling of incomplete bladder emptying: Secondary | ICD-10-CM | POA: Diagnosis not present

## 2015-06-05 DIAGNOSIS — Z8546 Personal history of malignant neoplasm of prostate: Secondary | ICD-10-CM | POA: Diagnosis not present

## 2015-07-11 DIAGNOSIS — D519 Vitamin B12 deficiency anemia, unspecified: Secondary | ICD-10-CM | POA: Diagnosis not present

## 2015-07-11 DIAGNOSIS — Z720 Tobacco use: Secondary | ICD-10-CM | POA: Diagnosis not present

## 2015-07-11 DIAGNOSIS — R5382 Chronic fatigue, unspecified: Secondary | ICD-10-CM | POA: Diagnosis not present

## 2015-07-11 DIAGNOSIS — D509 Iron deficiency anemia, unspecified: Secondary | ICD-10-CM | POA: Diagnosis not present

## 2015-07-11 DIAGNOSIS — E7800 Pure hypercholesterolemia, unspecified: Secondary | ICD-10-CM | POA: Diagnosis not present

## 2015-07-11 DIAGNOSIS — C61 Malignant neoplasm of prostate: Secondary | ICD-10-CM | POA: Diagnosis not present

## 2015-07-11 DIAGNOSIS — R000 Tachycardia, unspecified: Secondary | ICD-10-CM | POA: Diagnosis not present

## 2015-07-18 DIAGNOSIS — E559 Vitamin D deficiency, unspecified: Secondary | ICD-10-CM | POA: Diagnosis not present

## 2015-07-18 DIAGNOSIS — D509 Iron deficiency anemia, unspecified: Secondary | ICD-10-CM | POA: Diagnosis not present

## 2015-07-18 DIAGNOSIS — F17211 Nicotine dependence, cigarettes, in remission: Secondary | ICD-10-CM | POA: Diagnosis not present

## 2015-07-18 DIAGNOSIS — C4491 Basal cell carcinoma of skin, unspecified: Secondary | ICD-10-CM | POA: Diagnosis not present

## 2015-07-18 DIAGNOSIS — C61 Malignant neoplasm of prostate: Secondary | ICD-10-CM | POA: Diagnosis not present

## 2015-07-18 DIAGNOSIS — J439 Emphysema, unspecified: Secondary | ICD-10-CM | POA: Diagnosis not present

## 2015-08-12 DIAGNOSIS — N401 Enlarged prostate with lower urinary tract symptoms: Secondary | ICD-10-CM | POA: Diagnosis not present

## 2015-08-12 DIAGNOSIS — R3914 Feeling of incomplete bladder emptying: Secondary | ICD-10-CM | POA: Diagnosis not present

## 2015-08-12 DIAGNOSIS — R350 Frequency of micturition: Secondary | ICD-10-CM | POA: Diagnosis not present

## 2015-08-12 DIAGNOSIS — R351 Nocturia: Secondary | ICD-10-CM | POA: Diagnosis not present

## 2015-08-12 DIAGNOSIS — Z8546 Personal history of malignant neoplasm of prostate: Secondary | ICD-10-CM | POA: Diagnosis not present

## 2015-09-09 DIAGNOSIS — J302 Other seasonal allergic rhinitis: Secondary | ICD-10-CM | POA: Diagnosis not present

## 2015-09-09 DIAGNOSIS — J439 Emphysema, unspecified: Secondary | ICD-10-CM | POA: Diagnosis not present

## 2015-09-09 DIAGNOSIS — F17211 Nicotine dependence, cigarettes, in remission: Secondary | ICD-10-CM | POA: Diagnosis not present

## 2015-10-14 DIAGNOSIS — Z85828 Personal history of other malignant neoplasm of skin: Secondary | ICD-10-CM | POA: Diagnosis not present

## 2015-10-14 DIAGNOSIS — L570 Actinic keratosis: Secondary | ICD-10-CM | POA: Diagnosis not present

## 2015-12-16 DIAGNOSIS — R000 Tachycardia, unspecified: Secondary | ICD-10-CM | POA: Diagnosis not present

## 2015-12-16 DIAGNOSIS — D509 Iron deficiency anemia, unspecified: Secondary | ICD-10-CM | POA: Diagnosis not present

## 2015-12-16 DIAGNOSIS — J440 Chronic obstructive pulmonary disease with acute lower respiratory infection: Secondary | ICD-10-CM | POA: Diagnosis not present

## 2015-12-16 DIAGNOSIS — E559 Vitamin D deficiency, unspecified: Secondary | ICD-10-CM | POA: Diagnosis not present

## 2015-12-16 DIAGNOSIS — C61 Malignant neoplasm of prostate: Secondary | ICD-10-CM | POA: Diagnosis not present

## 2015-12-16 DIAGNOSIS — Z8349 Family history of other endocrine, nutritional and metabolic diseases: Secondary | ICD-10-CM | POA: Diagnosis not present

## 2015-12-16 DIAGNOSIS — R5382 Chronic fatigue, unspecified: Secondary | ICD-10-CM | POA: Diagnosis not present

## 2015-12-16 DIAGNOSIS — E519 Thiamine deficiency, unspecified: Secondary | ICD-10-CM | POA: Diagnosis not present

## 2015-12-19 DIAGNOSIS — Z0001 Encounter for general adult medical examination with abnormal findings: Secondary | ICD-10-CM | POA: Diagnosis not present

## 2015-12-19 DIAGNOSIS — E559 Vitamin D deficiency, unspecified: Secondary | ICD-10-CM | POA: Diagnosis not present

## 2015-12-19 DIAGNOSIS — C4491 Basal cell carcinoma of skin, unspecified: Secondary | ICD-10-CM | POA: Diagnosis not present

## 2015-12-19 DIAGNOSIS — D509 Iron deficiency anemia, unspecified: Secondary | ICD-10-CM | POA: Diagnosis not present

## 2015-12-19 DIAGNOSIS — Z6823 Body mass index (BMI) 23.0-23.9, adult: Secondary | ICD-10-CM | POA: Diagnosis not present

## 2015-12-19 DIAGNOSIS — C61 Malignant neoplasm of prostate: Secondary | ICD-10-CM | POA: Diagnosis not present

## 2015-12-19 DIAGNOSIS — Z23 Encounter for immunization: Secondary | ICD-10-CM | POA: Diagnosis not present

## 2015-12-19 DIAGNOSIS — J439 Emphysema, unspecified: Secondary | ICD-10-CM | POA: Diagnosis not present

## 2016-01-09 DIAGNOSIS — D500 Iron deficiency anemia secondary to blood loss (chronic): Secondary | ICD-10-CM | POA: Diagnosis not present

## 2016-02-20 DIAGNOSIS — C61 Malignant neoplasm of prostate: Secondary | ICD-10-CM | POA: Diagnosis not present

## 2016-02-20 DIAGNOSIS — Z791 Long term (current) use of non-steroidal anti-inflammatories (NSAID): Secondary | ICD-10-CM | POA: Diagnosis not present

## 2016-02-20 DIAGNOSIS — Z7951 Long term (current) use of inhaled steroids: Secondary | ICD-10-CM | POA: Diagnosis not present

## 2016-02-20 DIAGNOSIS — Z79899 Other long term (current) drug therapy: Secondary | ICD-10-CM | POA: Diagnosis not present

## 2016-03-30 DIAGNOSIS — J449 Chronic obstructive pulmonary disease, unspecified: Secondary | ICD-10-CM | POA: Diagnosis not present

## 2016-03-30 DIAGNOSIS — G4734 Idiopathic sleep related nonobstructive alveolar hypoventilation: Secondary | ICD-10-CM | POA: Diagnosis not present

## 2016-04-13 DIAGNOSIS — Z23 Encounter for immunization: Secondary | ICD-10-CM | POA: Diagnosis not present

## 2016-04-20 DIAGNOSIS — C44319 Basal cell carcinoma of skin of other parts of face: Secondary | ICD-10-CM | POA: Diagnosis not present

## 2016-04-20 DIAGNOSIS — Z85828 Personal history of other malignant neoplasm of skin: Secondary | ICD-10-CM | POA: Diagnosis not present

## 2016-06-04 DIAGNOSIS — R5382 Chronic fatigue, unspecified: Secondary | ICD-10-CM | POA: Diagnosis not present

## 2016-06-04 DIAGNOSIS — J439 Emphysema, unspecified: Secondary | ICD-10-CM | POA: Diagnosis not present

## 2016-06-04 DIAGNOSIS — D509 Iron deficiency anemia, unspecified: Secondary | ICD-10-CM | POA: Diagnosis not present

## 2016-06-04 DIAGNOSIS — Z87891 Personal history of nicotine dependence: Secondary | ICD-10-CM | POA: Diagnosis not present

## 2016-06-04 DIAGNOSIS — C61 Malignant neoplasm of prostate: Secondary | ICD-10-CM | POA: Diagnosis not present

## 2016-06-04 DIAGNOSIS — J440 Chronic obstructive pulmonary disease with acute lower respiratory infection: Secondary | ICD-10-CM | POA: Diagnosis not present

## 2016-06-04 DIAGNOSIS — E559 Vitamin D deficiency, unspecified: Secondary | ICD-10-CM | POA: Diagnosis not present

## 2016-06-04 DIAGNOSIS — E782 Mixed hyperlipidemia: Secondary | ICD-10-CM | POA: Diagnosis not present

## 2016-06-09 DIAGNOSIS — J302 Other seasonal allergic rhinitis: Secondary | ICD-10-CM | POA: Diagnosis not present

## 2016-06-09 DIAGNOSIS — J439 Emphysema, unspecified: Secondary | ICD-10-CM | POA: Diagnosis not present

## 2016-06-09 DIAGNOSIS — C61 Malignant neoplasm of prostate: Secondary | ICD-10-CM | POA: Diagnosis not present

## 2016-06-09 DIAGNOSIS — Z6824 Body mass index (BMI) 24.0-24.9, adult: Secondary | ICD-10-CM | POA: Diagnosis not present

## 2016-06-09 DIAGNOSIS — R221 Localized swelling, mass and lump, neck: Secondary | ICD-10-CM | POA: Diagnosis not present

## 2016-06-09 DIAGNOSIS — Z87891 Personal history of nicotine dependence: Secondary | ICD-10-CM | POA: Diagnosis not present

## 2016-06-09 DIAGNOSIS — E782 Mixed hyperlipidemia: Secondary | ICD-10-CM | POA: Diagnosis not present

## 2016-06-09 DIAGNOSIS — D509 Iron deficiency anemia, unspecified: Secondary | ICD-10-CM | POA: Diagnosis not present

## 2016-08-10 DIAGNOSIS — R350 Frequency of micturition: Secondary | ICD-10-CM | POA: Diagnosis not present

## 2016-08-10 DIAGNOSIS — Z87440 Personal history of urinary (tract) infections: Secondary | ICD-10-CM | POA: Diagnosis not present

## 2016-08-10 DIAGNOSIS — N401 Enlarged prostate with lower urinary tract symptoms: Secondary | ICD-10-CM | POA: Diagnosis not present

## 2016-08-10 DIAGNOSIS — R3915 Urgency of urination: Secondary | ICD-10-CM | POA: Diagnosis not present

## 2016-08-10 DIAGNOSIS — Z8546 Personal history of malignant neoplasm of prostate: Secondary | ICD-10-CM | POA: Diagnosis not present

## 2016-08-10 DIAGNOSIS — R3914 Feeling of incomplete bladder emptying: Secondary | ICD-10-CM | POA: Diagnosis not present

## 2016-09-29 DIAGNOSIS — G4734 Idiopathic sleep related nonobstructive alveolar hypoventilation: Secondary | ICD-10-CM | POA: Diagnosis not present

## 2016-09-29 DIAGNOSIS — J449 Chronic obstructive pulmonary disease, unspecified: Secondary | ICD-10-CM | POA: Diagnosis not present

## 2016-09-29 DIAGNOSIS — I10 Essential (primary) hypertension: Secondary | ICD-10-CM | POA: Diagnosis not present

## 2016-10-06 DIAGNOSIS — I160 Hypertensive urgency: Secondary | ICD-10-CM | POA: Diagnosis not present

## 2016-10-06 DIAGNOSIS — J439 Emphysema, unspecified: Secondary | ICD-10-CM | POA: Diagnosis not present

## 2016-10-06 DIAGNOSIS — R05 Cough: Secondary | ICD-10-CM | POA: Diagnosis not present

## 2016-10-20 DIAGNOSIS — L570 Actinic keratosis: Secondary | ICD-10-CM | POA: Diagnosis not present

## 2016-12-17 DIAGNOSIS — R5382 Chronic fatigue, unspecified: Secondary | ICD-10-CM | POA: Diagnosis not present

## 2016-12-17 DIAGNOSIS — E559 Vitamin D deficiency, unspecified: Secondary | ICD-10-CM | POA: Diagnosis not present

## 2016-12-17 DIAGNOSIS — K219 Gastro-esophageal reflux disease without esophagitis: Secondary | ICD-10-CM | POA: Diagnosis not present

## 2016-12-17 DIAGNOSIS — E782 Mixed hyperlipidemia: Secondary | ICD-10-CM | POA: Diagnosis not present

## 2016-12-17 DIAGNOSIS — F17211 Nicotine dependence, cigarettes, in remission: Secondary | ICD-10-CM | POA: Diagnosis not present

## 2016-12-22 DIAGNOSIS — R221 Localized swelling, mass and lump, neck: Secondary | ICD-10-CM | POA: Diagnosis not present

## 2016-12-22 DIAGNOSIS — C61 Malignant neoplasm of prostate: Secondary | ICD-10-CM | POA: Diagnosis not present

## 2016-12-22 DIAGNOSIS — Z0001 Encounter for general adult medical examination with abnormal findings: Secondary | ICD-10-CM | POA: Diagnosis not present

## 2016-12-22 DIAGNOSIS — D509 Iron deficiency anemia, unspecified: Secondary | ICD-10-CM | POA: Diagnosis not present

## 2016-12-22 DIAGNOSIS — J439 Emphysema, unspecified: Secondary | ICD-10-CM | POA: Diagnosis not present

## 2016-12-22 DIAGNOSIS — R000 Tachycardia, unspecified: Secondary | ICD-10-CM | POA: Diagnosis not present

## 2016-12-22 DIAGNOSIS — Z6823 Body mass index (BMI) 23.0-23.9, adult: Secondary | ICD-10-CM | POA: Diagnosis not present

## 2016-12-22 DIAGNOSIS — E782 Mixed hyperlipidemia: Secondary | ICD-10-CM | POA: Diagnosis not present

## 2017-02-15 DIAGNOSIS — Z6822 Body mass index (BMI) 22.0-22.9, adult: Secondary | ICD-10-CM | POA: Diagnosis not present

## 2017-02-15 DIAGNOSIS — J440 Chronic obstructive pulmonary disease with acute lower respiratory infection: Secondary | ICD-10-CM | POA: Diagnosis not present

## 2017-02-15 DIAGNOSIS — Z23 Encounter for immunization: Secondary | ICD-10-CM | POA: Diagnosis not present

## 2017-02-15 DIAGNOSIS — C61 Malignant neoplasm of prostate: Secondary | ICD-10-CM | POA: Diagnosis not present

## 2017-02-15 DIAGNOSIS — J302 Other seasonal allergic rhinitis: Secondary | ICD-10-CM | POA: Diagnosis not present

## 2017-03-23 DIAGNOSIS — Z79899 Other long term (current) drug therapy: Secondary | ICD-10-CM | POA: Diagnosis not present

## 2017-03-23 DIAGNOSIS — Z791 Long term (current) use of non-steroidal anti-inflammatories (NSAID): Secondary | ICD-10-CM | POA: Diagnosis not present

## 2017-03-23 DIAGNOSIS — C61 Malignant neoplasm of prostate: Secondary | ICD-10-CM | POA: Diagnosis not present

## 2017-03-23 DIAGNOSIS — Z8546 Personal history of malignant neoplasm of prostate: Secondary | ICD-10-CM | POA: Diagnosis not present

## 2017-03-23 DIAGNOSIS — Z08 Encounter for follow-up examination after completed treatment for malignant neoplasm: Secondary | ICD-10-CM | POA: Diagnosis not present

## 2017-03-23 DIAGNOSIS — Z7951 Long term (current) use of inhaled steroids: Secondary | ICD-10-CM | POA: Diagnosis not present

## 2017-03-24 DIAGNOSIS — Z791 Long term (current) use of non-steroidal anti-inflammatories (NSAID): Secondary | ICD-10-CM | POA: Diagnosis not present

## 2017-03-24 DIAGNOSIS — Z7951 Long term (current) use of inhaled steroids: Secondary | ICD-10-CM | POA: Diagnosis not present

## 2017-03-24 DIAGNOSIS — Z79899 Other long term (current) drug therapy: Secondary | ICD-10-CM | POA: Diagnosis not present

## 2017-03-24 DIAGNOSIS — C61 Malignant neoplasm of prostate: Secondary | ICD-10-CM | POA: Diagnosis not present

## 2017-04-07 DIAGNOSIS — J449 Chronic obstructive pulmonary disease, unspecified: Secondary | ICD-10-CM | POA: Diagnosis not present

## 2017-04-22 DIAGNOSIS — L570 Actinic keratosis: Secondary | ICD-10-CM | POA: Diagnosis not present

## 2017-06-21 DIAGNOSIS — R000 Tachycardia, unspecified: Secondary | ICD-10-CM | POA: Diagnosis not present

## 2017-06-21 DIAGNOSIS — J439 Emphysema, unspecified: Secondary | ICD-10-CM | POA: Diagnosis not present

## 2017-06-21 DIAGNOSIS — R0600 Dyspnea, unspecified: Secondary | ICD-10-CM | POA: Diagnosis not present

## 2017-06-21 DIAGNOSIS — Z6822 Body mass index (BMI) 22.0-22.9, adult: Secondary | ICD-10-CM | POA: Diagnosis not present

## 2017-06-21 DIAGNOSIS — J440 Chronic obstructive pulmonary disease with acute lower respiratory infection: Secondary | ICD-10-CM | POA: Diagnosis not present

## 2017-06-22 DIAGNOSIS — K219 Gastro-esophageal reflux disease without esophagitis: Secondary | ICD-10-CM | POA: Diagnosis not present

## 2017-06-22 DIAGNOSIS — E782 Mixed hyperlipidemia: Secondary | ICD-10-CM | POA: Diagnosis not present

## 2017-06-22 DIAGNOSIS — D509 Iron deficiency anemia, unspecified: Secondary | ICD-10-CM | POA: Diagnosis not present

## 2017-06-22 DIAGNOSIS — Z720 Tobacco use: Secondary | ICD-10-CM | POA: Diagnosis not present

## 2017-06-22 DIAGNOSIS — E559 Vitamin D deficiency, unspecified: Secondary | ICD-10-CM | POA: Diagnosis not present

## 2017-06-22 DIAGNOSIS — J440 Chronic obstructive pulmonary disease with acute lower respiratory infection: Secondary | ICD-10-CM | POA: Diagnosis not present

## 2017-06-22 DIAGNOSIS — F17211 Nicotine dependence, cigarettes, in remission: Secondary | ICD-10-CM | POA: Diagnosis not present

## 2017-06-22 DIAGNOSIS — R5382 Chronic fatigue, unspecified: Secondary | ICD-10-CM | POA: Diagnosis not present

## 2017-06-24 DIAGNOSIS — J440 Chronic obstructive pulmonary disease with acute lower respiratory infection: Secondary | ICD-10-CM | POA: Diagnosis not present

## 2017-06-24 DIAGNOSIS — D509 Iron deficiency anemia, unspecified: Secondary | ICD-10-CM | POA: Diagnosis not present

## 2017-06-24 DIAGNOSIS — E782 Mixed hyperlipidemia: Secondary | ICD-10-CM | POA: Diagnosis not present

## 2017-06-24 DIAGNOSIS — J439 Emphysema, unspecified: Secondary | ICD-10-CM | POA: Diagnosis not present

## 2017-06-24 DIAGNOSIS — R000 Tachycardia, unspecified: Secondary | ICD-10-CM | POA: Diagnosis not present

## 2017-06-24 DIAGNOSIS — Z6822 Body mass index (BMI) 22.0-22.9, adult: Secondary | ICD-10-CM | POA: Diagnosis not present

## 2017-06-25 DIAGNOSIS — I2699 Other pulmonary embolism without acute cor pulmonale: Secondary | ICD-10-CM | POA: Diagnosis not present

## 2017-06-25 DIAGNOSIS — I700 Atherosclerosis of aorta: Secondary | ICD-10-CM | POA: Diagnosis not present

## 2017-06-25 DIAGNOSIS — J439 Emphysema, unspecified: Secondary | ICD-10-CM | POA: Diagnosis not present

## 2017-07-19 DIAGNOSIS — K219 Gastro-esophageal reflux disease without esophagitis: Secondary | ICD-10-CM | POA: Diagnosis not present

## 2017-07-19 DIAGNOSIS — R0602 Shortness of breath: Secondary | ICD-10-CM | POA: Diagnosis not present

## 2017-07-19 DIAGNOSIS — Z7901 Long term (current) use of anticoagulants: Secondary | ICD-10-CM | POA: Diagnosis not present

## 2017-07-19 DIAGNOSIS — Z882 Allergy status to sulfonamides status: Secondary | ICD-10-CM | POA: Diagnosis not present

## 2017-07-19 DIAGNOSIS — J441 Chronic obstructive pulmonary disease with (acute) exacerbation: Secondary | ICD-10-CM | POA: Diagnosis not present

## 2017-07-19 DIAGNOSIS — I2699 Other pulmonary embolism without acute cor pulmonale: Secondary | ICD-10-CM | POA: Diagnosis not present

## 2017-07-19 DIAGNOSIS — Z8546 Personal history of malignant neoplasm of prostate: Secondary | ICD-10-CM | POA: Diagnosis not present

## 2017-07-19 DIAGNOSIS — Z79899 Other long term (current) drug therapy: Secondary | ICD-10-CM | POA: Diagnosis not present

## 2017-07-19 DIAGNOSIS — Z87891 Personal history of nicotine dependence: Secondary | ICD-10-CM | POA: Diagnosis not present

## 2017-07-20 ENCOUNTER — Observation Stay (HOSPITAL_COMMUNITY): Payer: Medicare Other

## 2017-07-20 ENCOUNTER — Other Ambulatory Visit: Payer: Self-pay

## 2017-07-20 ENCOUNTER — Observation Stay (HOSPITAL_COMMUNITY)
Admission: EM | Admit: 2017-07-20 | Discharge: 2017-07-21 | Disposition: A | Discharge status: Home / Self Care | Payer: Medicare Other | Source: Other Acute Inpatient Hospital | Attending: Family Medicine | Admitting: Family Medicine

## 2017-07-20 ENCOUNTER — Encounter (HOSPITAL_COMMUNITY): Payer: Self-pay | Admitting: Nurse Practitioner

## 2017-07-20 DIAGNOSIS — I2699 Other pulmonary embolism without acute cor pulmonale: Secondary | ICD-10-CM | POA: Diagnosis present

## 2017-07-20 DIAGNOSIS — R000 Tachycardia, unspecified: Secondary | ICD-10-CM | POA: Insufficient documentation

## 2017-07-20 DIAGNOSIS — I4711 Inappropriate sinus tachycardia, so stated: Secondary | ICD-10-CM | POA: Diagnosis present

## 2017-07-20 DIAGNOSIS — J9621 Acute and chronic respiratory failure with hypoxia: Secondary | ICD-10-CM | POA: Diagnosis not present

## 2017-07-20 DIAGNOSIS — C61 Malignant neoplasm of prostate: Secondary | ICD-10-CM | POA: Diagnosis present

## 2017-07-20 DIAGNOSIS — Z7951 Long term (current) use of inhaled steroids: Secondary | ICD-10-CM | POA: Diagnosis not present

## 2017-07-20 DIAGNOSIS — K219 Gastro-esophageal reflux disease without esophagitis: Secondary | ICD-10-CM | POA: Insufficient documentation

## 2017-07-20 DIAGNOSIS — D509 Iron deficiency anemia, unspecified: Secondary | ICD-10-CM | POA: Diagnosis not present

## 2017-07-20 DIAGNOSIS — I4510 Unspecified right bundle-branch block: Secondary | ICD-10-CM | POA: Insufficient documentation

## 2017-07-20 DIAGNOSIS — Z87891 Personal history of nicotine dependence: Secondary | ICD-10-CM | POA: Insufficient documentation

## 2017-07-20 DIAGNOSIS — R911 Solitary pulmonary nodule: Secondary | ICD-10-CM | POA: Insufficient documentation

## 2017-07-20 DIAGNOSIS — Z7901 Long term (current) use of anticoagulants: Secondary | ICD-10-CM | POA: Diagnosis not present

## 2017-07-20 DIAGNOSIS — R0602 Shortness of breath: Secondary | ICD-10-CM

## 2017-07-20 DIAGNOSIS — Z9981 Dependence on supplemental oxygen: Secondary | ICD-10-CM | POA: Diagnosis not present

## 2017-07-20 DIAGNOSIS — Z79899 Other long term (current) drug therapy: Secondary | ICD-10-CM | POA: Insufficient documentation

## 2017-07-20 DIAGNOSIS — J441 Chronic obstructive pulmonary disease with (acute) exacerbation: Principal | ICD-10-CM | POA: Insufficient documentation

## 2017-07-20 DIAGNOSIS — Z86711 Personal history of pulmonary embolism: Secondary | ICD-10-CM | POA: Diagnosis not present

## 2017-07-20 DIAGNOSIS — J9602 Acute respiratory failure with hypercapnia: Secondary | ICD-10-CM

## 2017-07-20 DIAGNOSIS — Z8546 Personal history of malignant neoplasm of prostate: Secondary | ICD-10-CM | POA: Diagnosis not present

## 2017-07-20 DIAGNOSIS — J9611 Chronic respiratory failure with hypoxia: Secondary | ICD-10-CM | POA: Diagnosis present

## 2017-07-20 DIAGNOSIS — J9601 Acute respiratory failure with hypoxia: Secondary | ICD-10-CM | POA: Diagnosis present

## 2017-07-20 HISTORY — DX: Benign lipomatous neoplasm of skin and subcutaneous tissue of head, face and neck: D17.0

## 2017-07-20 HISTORY — DX: Other seasonal allergic rhinitis: J30.2

## 2017-07-20 HISTORY — DX: Iron deficiency anemia, unspecified: D50.9

## 2017-07-20 HISTORY — DX: Chronic obstructive pulmonary disease, unspecified: J44.9

## 2017-07-20 HISTORY — DX: Other pulmonary embolism without acute cor pulmonale: I26.99

## 2017-07-20 HISTORY — DX: Malignant neoplasm of prostate: C61

## 2017-07-20 HISTORY — DX: Chronic respiratory failure with hypoxia: J96.11

## 2017-07-20 HISTORY — DX: Gastro-esophageal reflux disease without esophagitis: K21.9

## 2017-07-20 HISTORY — DX: Personal history of nicotine dependence: Z87.891

## 2017-07-20 LAB — COMPREHENSIVE METABOLIC PANEL
ALK PHOS: 70 U/L (ref 38–126)
ALT: 25 U/L (ref 17–63)
AST: 24 U/L (ref 15–41)
Albumin: 3.3 g/dL — ABNORMAL LOW (ref 3.5–5.0)
Anion gap: 12 (ref 5–15)
BUN: 6 mg/dL (ref 6–20)
CALCIUM: 8.9 mg/dL (ref 8.9–10.3)
CHLORIDE: 103 mmol/L (ref 101–111)
CO2: 24 mmol/L (ref 22–32)
CREATININE: 0.82 mg/dL (ref 0.61–1.24)
GFR calc non Af Amer: >60 mL/min (ref >60)
Glucose, Bld: 171 mg/dL — ABNORMAL HIGH (ref 65–99)
Potassium: 3.9 mmol/L (ref 3.5–5.1)
SODIUM: 139 mmol/L (ref 135–145)
Total Bilirubin: 0.6 mg/dL (ref 0.3–1.2)
Total Protein: 7.1 g/dL (ref 6.5–8.1)

## 2017-07-20 LAB — CBC WITH DIFFERENTIAL/PLATELET
BASOS ABS: 0 10*3/uL (ref 0.0–0.1)
Basophils Relative: 0 %
EOS ABS: 0 10*3/uL (ref 0.0–0.7)
EOS PCT: 0 %
HCT: 37.6 % — ABNORMAL LOW (ref 39.0–52.0)
HEMOGLOBIN: 12.6 g/dL — AB (ref 13.0–17.0)
LYMPHS ABS: 0.8 10*3/uL (ref 0.7–4.0)
Lymphocytes Relative: 9 %
MCH: 29.3 pg (ref 26.0–34.0)
MCHC: 33.5 g/dL (ref 30.0–36.0)
MCV: 87.4 fL (ref 78.0–100.0)
Monocytes Absolute: 0.3 10*3/uL (ref 0.1–1.0)
Monocytes Relative: 3 %
NEUTROS PCT: 88 %
Neutro Abs: 7.6 10*3/uL (ref 1.7–7.7)
PLATELETS: 305 10*3/uL (ref 150–400)
RBC: 4.3 MIL/uL (ref 4.22–5.81)
RDW: 13 % (ref 11.5–15.5)
WBC: 8.7 10*3/uL (ref 4.0–10.5)

## 2017-07-20 LAB — BRAIN NATRIURETIC PEPTIDE: B NATRIURETIC PEPTIDE 5: 78 pg/mL (ref 0.0–100.0)

## 2017-07-20 LAB — TROPONIN I: Troponin I: <0.03 ng/mL (ref <0.03)

## 2017-07-20 MED ORDER — ONDANSETRON HCL 4 MG/2ML IJ SOLN: 4.0000 mg | Freq: Four times a day (QID) | INTRAMUSCULAR | Status: DC | PRN

## 2017-07-20 MED ORDER — MONTELUKAST SODIUM 10 MG PO TABS
10.0000 mg | ORAL_TABLET | Freq: Every day | ORAL | Status: DC
Start: 2017-07-20 — End: 2017-07-21
  Administered 2017-07-20: 10 mg via ORAL
  Filled 2017-07-20: qty 1

## 2017-07-20 MED ORDER — FINASTERIDE 5 MG PO TABS
5.0000 mg | ORAL_TABLET | Freq: Every day | ORAL | Status: DC
  Administered 2017-07-20 – 2017-07-21 (×2): 5 mg via ORAL
  Filled 2017-07-20 (×2): qty 1

## 2017-07-20 MED ORDER — RIVAROXABAN 20 MG PO TABS
20.0000 mg | ORAL_TABLET | Freq: Every day | ORAL | Status: DC
  Administered 2017-07-20 – 2017-07-21 (×2): 20 mg via ORAL
  Filled 2017-07-20 (×2): qty 1

## 2017-07-20 MED ORDER — IPRATROPIUM-ALBUTEROL 0.5-2.5 (3) MG/3ML IN SOLN: 3.0000 mL | RESPIRATORY_TRACT | Status: DC | PRN

## 2017-07-20 MED ORDER — VITAMIN D 1000 UNITS PO TABS
1000.0000 [IU] | ORAL_TABLET | Freq: Every day | ORAL | Status: DC
  Administered 2017-07-20 – 2017-07-21 (×2): 1000 [IU] via ORAL
  Filled 2017-07-20 (×2): qty 1

## 2017-07-20 MED ORDER — ONDANSETRON HCL 4 MG PO TABS: 4.0000 mg | ORAL_TABLET | Freq: Four times a day (QID) | ORAL | Status: DC | PRN

## 2017-07-20 MED ORDER — IPRATROPIUM-ALBUTEROL 0.5-2.5 (3) MG/3ML IN SOLN
3.0000 mL | RESPIRATORY_TRACT | Status: DC
  Administered 2017-07-20: 3 mL via RESPIRATORY_TRACT
  Filled 2017-07-20: qty 3

## 2017-07-20 MED ORDER — ACETAMINOPHEN 650 MG RE SUPP: 650.0000 mg | Freq: Four times a day (QID) | RECTAL | Status: DC | PRN

## 2017-07-20 MED ORDER — SODIUM CHLORIDE 0.9 % IV SOLN
INTRAVENOUS | Status: DC
  Administered 2017-07-20 – 2017-07-21 (×2): via INTRAVENOUS

## 2017-07-20 MED ORDER — FAMOTIDINE 20 MG PO TABS
20.0000 mg | ORAL_TABLET | Freq: Two times a day (BID) | ORAL | Status: DC
  Administered 2017-07-20 – 2017-07-21 (×3): 20 mg via ORAL
  Filled 2017-07-20 (×3): qty 1

## 2017-07-20 MED ORDER — TAMSULOSIN HCL 0.4 MG PO CAPS
0.4000 mg | ORAL_CAPSULE | Freq: Every day | ORAL | Status: DC
  Administered 2017-07-20: 0.4 mg via ORAL
  Filled 2017-07-20: qty 1

## 2017-07-20 MED ORDER — METHYLPREDNISOLONE SODIUM SUCC 125 MG IJ SOLR
60.0000 mg | Freq: Four times a day (QID) | INTRAMUSCULAR | Status: DC
  Administered 2017-07-20 – 2017-07-21 (×6): 60 mg via INTRAVENOUS
  Filled 2017-07-20 (×6): qty 2

## 2017-07-20 MED ORDER — IOPAMIDOL (ISOVUE-370) INJECTION 76%
INTRAVENOUS | Status: AC
  Administered 2017-07-20: 100 mL
  Filled 2017-07-20: qty 100

## 2017-07-20 MED ORDER — ACETAMINOPHEN 325 MG PO TABS: 650.0000 mg | ORAL_TABLET | Freq: Four times a day (QID) | ORAL | Status: DC | PRN

## 2017-07-20 MED ORDER — SODIUM CHLORIDE 0.9% FLUSH
3.0000 mL | Freq: Two times a day (BID) | INTRAVENOUS | Status: DC
  Administered 2017-07-20 – 2017-07-21 (×3): 3 mL via INTRAVENOUS

## 2017-07-20 MED ORDER — LEVALBUTEROL HCL 0.63 MG/3ML IN NEBU
0.6300 mg | INHALATION_SOLUTION | Freq: Three times a day (TID) | RESPIRATORY_TRACT | Status: DC
Start: 2017-07-20 — End: 2017-07-21
  Administered 2017-07-20 (×2): 0.63 mg via RESPIRATORY_TRACT
  Filled 2017-07-20 (×2): qty 3

## 2017-07-20 MED ORDER — FERROUS SULFATE 325 (65 FE) MG PO TABS
325.0000 mg | ORAL_TABLET | Freq: Two times a day (BID) | ORAL | Status: DC
  Administered 2017-07-20 – 2017-07-21 (×3): 325 mg via ORAL
  Filled 2017-07-20 (×3): qty 1

## 2017-07-20 NOTE — Progress Notes (Addendum)
CTA of the chest did not reveal any evidence of pulmonary embolism but did demonstrate a 6 mm right lower lobe pulmonary nodule.  Radiologist recommends noncontrast chest CT at 6-12 months.  Patient is a former smoker.  Additional recommendation for future CT at 18-24 months from today's scan given he is a high risk patient.  Patient continues to have inappropriate tachycardia with ventricular rates in the 130s.  Patient may require administration of AVN blocking agent initiated this admission and subsequently continued after discharge. Possibly elevated HR 2/2 acute decompensation in pulmonary status.  Erin Hearing, ANP

## 2017-07-20 NOTE — H&P (Addendum)
History and Physical    Leonard Lane JQB:[id number] DOB: [birth year] DOA: 07/20/2017  **Will place patient in observation status based on the expectation that the patient will need hospitalization/ hospital care for less than 24 hours duration  PCP: Curlene Labrum, MD   Attending physician: Lorin Mercy  Patient coming from/Resides with: Private residence  Chief Complaint: Shortness of breath  HPI: Leonard Lane is a 72 y.o. male with medical history significant for chronic hypoxemic respiratory failure on nocturnal oxygen, COPD and former tobacco abuse, history of prostate cancer, BPH, iron deficiency anemia, GERD, and newly diagnosed PE 4 weeks ago.  Patient reports that this past Wednesday he began developing dry cough without fevers but he did feel sensations of cold alternating with hot.  By Sunday evening his respiratory symptoms have worsened and he was quite short of breath at rest and worse with walking.  He presented to [facility] ER and was essentially not moving air on exam and required a 100% nonrebreather mask.  He had been given Solu-Medrol, Levaquin and multiple nebs and was able to be weaned to 3 L oxygen but was still tachycardic.  Chest x-ray and labs were unremarkable.  ABG did revealed mild hypercarbia with a pH of 7.34 and a PO2 of 193 with 60% FiO2.  Due to lack of available beds request was made to transfer patient to this facility.  ED Course: ([place]) Vital Signs: BP 142/87 (BP Location: Left Arm)   Pulse (!) 129   Temp 98 F (36.7 C) (Oral)   Resp 36   Ht 5\' 7"  (1.702 m)   Wt 64.5 kg (142 lb 4.8 oz)   SpO2 95% on 100% NRB mask   BMI 22.29 kg/m  CXR: Neg Lab data: BNP 43, white count 6900 with hemoglobin 13.3, platelets 327,000, neutrophils 66%, absolute neutrophils 4.6%, CK 2.8, sodium 138, potassium 3.9, chloride 100, CO2 27, anion gap 11, calcium 8.8, BUN 9, creatinine 0.78 with a GFR of 90, glucose 136 LFTs not elevated, coags  normal, troponin 0.01, pro calcitonin 0.67 with high normal range of 0.5 ABG: PH 7.34, PCO2 50.1, PO2 193, bicarb 27, sat 99.5 Medications and treatments: Levaquin 750 mg IV x1, Solu-Medrol 125 mg IV x1, normal saline 1 L bolus x1 DuoNeb x3  Review of Systems:  In addition to the HPI above,  No Fever-chills, myalgias or other constitutional symptoms No Headache, changes with Vision or hearing, new weakness, tingling, numbness in any extremity, dizziness, dysarthria or word finding difficulty, gait disturbance or imbalance, tremors or seizure activity No problems swallowing food or Liquids, indigestion/reflux, choking or coughing while eating, abdominal pain with or after eating No Chest pain, palpitations, orthopnea  No Abdominal pain, N/V, melena,hematochezia, dark tarry stools, constipation No dysuria, malodorous urine, hematuria or flank pain No new skin rashes, lesions, masses or bruises, No new joint pains, aches, swelling or redness No recent unintentional weight gain or loss No polyuria, polydypsia or polyphagia   Past Medical History:  Diagnosis Date  . Chronic respiratory failure with hypoxia (HCC)    Nocturnal O2 @ 2L  . COPD (chronic obstructive pulmonary disease) ([place])   . Former tobacco use   . GERD (gastroesophageal reflux disease)   . Iron deficiency anemia   . Lipoma of left neck   . Prostate cancer ([place])    s/p seeds  . Pulmonary embolism [facility])    Jan 2019  . Seasonal allergies     Past Surgical History:  Procedure Laterality Date  . CATARACT EXTRACTION      Social History   Socioeconomic History  . Marital status: Widowed    Spouse name: Not on file  . Number of children: Not on file  . Years of education: Not on file  . Highest education level: Not on file  Social Needs  . Financial resource strain: Not on file  . Food insecurity - worry: Not on file  . Food insecurity - inability: Not on file  . Transportation needs - medical: Not on file  .  Transportation needs - non-medical: Not on file  Occupational History  . Not on file  Tobacco Use  . Smoking status: Former Research scientist (life sciences)  . Smokeless tobacco: Never Used  Substance and Sexual Activity  . Alcohol use: No    Frequency: Never  . Drug use: No  . Sexual activity: Not on file  Other Topics Concern  . Not on file  Social History Narrative  . Not on file    Mobility: Independent Work history: Not obtained   Allergies  Allergen Reactions  . Sulfa Antibiotics Nausea Only    History reviewed. No pertinent family history.   Prior to Admission medications   Symbicort 80-4.5 mcg inhalation 2 puffs twice daily Singulair 10 mg daily Oxygen 2 L at at bedtime Pro-air HFA 90 mcg actuation 1 puff 4 times per day Tamsulosin 0.4 mg daily Finasteride 5 mg daily Tessalon Perles 100 mg every 8 hours prn Iron CR 27 mg twice daily Ranitidine 150 mg twice daily Xarelto 20 mg daily Vitamin D-3 1000 units daily  **Medications obtained from patient's home list provided by son as well as prior facility ED records     Physical Exam: Vitals:   07/20/17 0430 07/20/17 0758 07/20/17 0828  BP: 123/82    Pulse: (!) 122  (P) 94  Resp: 18    Temp: 98 F (36.7 C)    TempSrc: Oral    SpO2: 96% 97% (P) 94%  Weight: 64.5 kg (142 lb 4.8 oz)    Height: 5\' 7"  (1.702 m)        Constitutional: NAD, calm, comfortable Eyes: PERRL, lids and conjunctivae normal ENMT: Mucous membranes are moist. Posterior pharynx clear of any exudate or lesions.Normal dentition.  Large soft lipoma left neck. Neck: normal, supple, no masses, no thyromegaly Respiratory: Diminished air movement throughout with scattered expiratory crackles and rhonchi primarily in the mid fields.  Normal respiratory effort without accessory muscle use rest. 2L Cardiovascular: Regular but tachycardic rate and rhythm, no murmurs / rubs / gallops. No extremity edema. 2+ pedal pulses. No carotid bruits.  Abdomen: no tenderness, no  masses palpated. No hepatosplenomegaly. Bowel sounds positive.  Musculoskeletal: no clubbing / cyanosis. No joint deformity upper and lower extremities. Good ROM, no contractures. Normal muscle tone.  Skin: no rashes, lesions, ulcers. No induration Neurologic: CN 2-12 grossly intact. Sensation intact, DTR normal. Strength 5/5 x all 4 extremities.  Psychiatric: Normal judgment and insight. Alert and oriented x 3. Normal mood.    Labs on Admission: I have personally reviewed following labs and imaging studies  CBC: Recent Labs  Lab 07/20/17 0454  WBC 8.7  NEUTROABS 7.6  HGB 12.6*  HCT 37.6*  MCV 87.4  PLT 782   Basic Metabolic Panel: Recent Labs  Lab 07/20/17 0454  NA 139  K 3.9  CL 103  CO2 24  GLUCOSE 171*  BUN 6  CREATININE 0.82  CALCIUM 8.9   GFR: Estimated Creatinine  Clearance: 74.3 mL/min (by C-G formula based on SCr of 0.82 mg/dL). Liver Function Tests: Recent Labs  Lab 07/20/17 0454  AST 24  ALT 25  ALKPHOS 70  BILITOT 0.6  PROT 7.1  ALBUMIN 3.3*   No results for input(s): LIPASE, AMYLASE in the last 168 hours. No results for input(s): AMMONIA in the last 168 hours. Coagulation Profile: No results for input(s): INR, PROTIME in the last 168 hours. Cardiac Enzymes: Recent Labs  Lab 07/20/17 0454  TROPONINI <0.03   BNP (last 3 results) No results for input(s): PROBNP in the last 8760 hours. HbA1C: No results for input(s): HGBA1C in the last 72 hours. CBG: No results for input(s): GLUCAP in the last 168 hours. Lipid Profile: No results for input(s): CHOL, HDL, LDLCALC, TRIG, CHOLHDL, LDLDIRECT in the last 72 hours. Thyroid Function Tests: No results for input(s): TSH, T4TOTAL, FREET4, T3FREE, THYROIDAB in the last 72 hours. Anemia Panel: No results for input(s): VITAMINB12, FOLATE, FERRITIN, TIBC, IRON, RETICCTPCT in the last 72 hours. Urine analysis: No results found for: COLORURINE, APPEARANCEUR, LABSPEC, PHURINE, GLUCOSEU, HGBUR, BILIRUBINUR,  KETONESUR, PROTEINUR, UROBILINOGEN, NITRITE, LEUKOCYTESUR Sepsis Labs: @LABRCNTIP (procalcitonin:4,lacticidven:4) )No results found for this or any previous visit (from the past 240 hour(s)).   Radiological Exams on Admission: Dg Chest Port 1 View  Result Date: 07/20/2017 CLINICAL DATA:  72 year old male with shortness of breath. EXAM: PORTABLE CHEST 1 VIEW COMPARISON:  None. FINDINGS: Probable mild emphysematous changes. No focal consolidation, pleural effusion, or pneumothorax. The cardiac silhouette is within normal limits. No acute osseous pathology. IMPRESSION: No active disease. Electronically Signed   By: Anner Crete M.D.   On: 07/20/2017 06:47    EKG: (Independently reviewed) sinus rhythm with ventricular rate 98 bpm, QTC 459 ms, underlying right bundle branch block, no definitive acute ischemic changes.  Assessment/Plan Principal Problem:   COPD with acute exacerbation w/ Acute on chronic respiratory failure with hypoxia and acute hypercapnia  -Patient presents with progressive respiratory symptoms primarily consisting of nonproductive cough, generalized malaise without fever, and difficulty moving air consistent with COPD exacerbation noting chest x-ray at previous facility without any focal infiltrate -Nonproductive cough, no fever or leukocytosis so no indication at this juncture to continue antibiotics given at previous facility -Initially presented with poor air movement with mild hypercarbia and trace respiratory acidosis -Mild increased O2 needs-typically utilizes 2 L oxygen at bedtime and currently stable on 2 L oxygen although still with poor air movement -Solu-Medrol 60 mg IV every 6 hours-likely can transition to prednisone with slow taper in a.m. -In context of tachycardia we will utilize Xopenex nebs every 8 hours -Early mobilization -Suspect nonspecified viral syndrome as culprit  Active Problems:   Pulmonary embolism  -New diagnosis 4 weeks ago -Continue  Xarelto    Iron deficiency anemia -Continue iron replacement -Recent hemoglobin >13 in context of possible dehydration -Repeat hemoglobin here 12.6 after hydration    RBBB -Suspect this is chronic in the context of COPD and expected cardiac remodeling -Additional evaluation such as echo at discretion of PCP    Inappropriate sinus tachycardia -Patient and son report appears to have baseline slightly rapid heart rate (noted at preoperative evaluation for cataract surgery several years ago) -Worsen tachycardia in context of multiple nebs in context of beta-2 agonist -Utilizing Xopenex as above -Defer initiation of AVN blocking agent to PCP if tachycardia persists   History of prostate cancer  -Continue Flomax and finasteride    GERD (gastroesophageal reflux disease) -Continue Zantac    **Additional  lab, imaging and/or diagnostic evaluation at discretion of supervising physician  DVT prophylaxis: Xarelto Code Status: Full Family Communication: Son Disposition Plan: Home Consults called: None    ELLIS,ALLISON L. ANP-BC [facility] Pager [phone]   If 7PM-7AM, please contact night-coverage [email or web address] Password [email or web address]  07/20/2017, 8:30 AM

## 2017-07-20 NOTE — Progress Notes (Signed)
Notified Dr. Lorin Mercy patient's HR jumped up to the 140's. Patient has come back down to 118. Will continue to monitor.

## 2017-07-20 NOTE — Progress Notes (Signed)
Notified Dr. Lorin Mercy that pt's HR jumped up to 150's. Back in 120's. Will continue to monitor.

## 2017-07-21 DIAGNOSIS — I2699 Other pulmonary embolism without acute cor pulmonale: Secondary | ICD-10-CM | POA: Diagnosis not present

## 2017-07-21 DIAGNOSIS — K219 Gastro-esophageal reflux disease without esophagitis: Secondary | ICD-10-CM | POA: Diagnosis not present

## 2017-07-21 DIAGNOSIS — Z86711 Personal history of pulmonary embolism: Secondary | ICD-10-CM | POA: Diagnosis not present

## 2017-07-21 DIAGNOSIS — R000 Tachycardia, unspecified: Secondary | ICD-10-CM

## 2017-07-21 DIAGNOSIS — D509 Iron deficiency anemia, unspecified: Secondary | ICD-10-CM | POA: Diagnosis not present

## 2017-07-21 DIAGNOSIS — R911 Solitary pulmonary nodule: Secondary | ICD-10-CM | POA: Diagnosis not present

## 2017-07-21 DIAGNOSIS — J9611 Chronic respiratory failure with hypoxia: Secondary | ICD-10-CM | POA: Diagnosis not present

## 2017-07-21 DIAGNOSIS — J9601 Acute respiratory failure with hypoxia: Secondary | ICD-10-CM

## 2017-07-21 DIAGNOSIS — J9621 Acute and chronic respiratory failure with hypoxia: Secondary | ICD-10-CM | POA: Diagnosis not present

## 2017-07-21 DIAGNOSIS — J441 Chronic obstructive pulmonary disease with (acute) exacerbation: Secondary | ICD-10-CM | POA: Diagnosis not present

## 2017-07-21 DIAGNOSIS — C61 Malignant neoplasm of prostate: Secondary | ICD-10-CM

## 2017-07-21 DIAGNOSIS — J9602 Acute respiratory failure with hypercapnia: Secondary | ICD-10-CM

## 2017-07-21 DIAGNOSIS — Z8546 Personal history of malignant neoplasm of prostate: Secondary | ICD-10-CM | POA: Diagnosis not present

## 2017-07-21 MED ORDER — PREDNISONE 10 MG PO TABS: ORAL_TABLET | ORAL | 0 refills | Status: AC

## 2017-07-21 MED ORDER — LEVALBUTEROL HCL 0.63 MG/3ML IN NEBU
0.6300 mg | INHALATION_SOLUTION | Freq: Two times a day (BID) | RESPIRATORY_TRACT | Status: DC
  Administered 2017-07-21: 0.63 mg via RESPIRATORY_TRACT
  Filled 2017-07-21: qty 3

## 2017-07-21 MED ORDER — ALBUTEROL SULFATE (2.5 MG/3ML) 0.083% IN NEBU: 2.5000 mg | INHALATION_SOLUTION | Freq: Four times a day (QID) | RESPIRATORY_TRACT | 1 refills | Status: AC | PRN

## 2017-07-21 NOTE — Care Management Note (Signed)
Case Management Note  Patient Details  Name: Leonard Lane MRN: [id number] Date of Birth: 10-May-1946  Subjective/Objective:  COPD acute exacerbation                 Action/Plan: Patient lives at home; PCP: Curlene Labrum, MD; has private insurance with Medicare; pharmacy of choice is [facility]; has home oxygen through [facility] in [place]; home nebulizer machine order through [facility] and they are to bring a portable oxygen tank to the hospital for discharge also. Fax # [phone].   Expected Discharge Date:  07/22/17               Expected Discharge Plan:   home/ self care  Status of Service:   In progress  Sherrilyn Rist [phone] 07/21/2017, 10:42 AM

## 2017-07-21 NOTE — Progress Notes (Signed)
SATURATION QUALIFICATIONS: (This note is used to comply with regulatory documentation for home oxygen)  Patient Saturations on Room Air at Rest = 91  Patient Saturations on Room Air while Ambulating = 87  Patient Saturations on 90% on 2L and 92% on 3L  Please briefly explain why patient needs home oxygen: Patient's oxygen is dropping while ambulating.

## 2017-07-21 NOTE — Progress Notes (Signed)
Reviewed discharge instructions/medications with pt and pt's son. Answered their questions. Nebulizer and oxygen was delivered to his room. Pt is stable and ready for discharge. HR has normalized.

## 2017-07-22 NOTE — Discharge Summary (Signed)
Physician Discharge Summary  Leonard Lane RKY:[id number] DOB: 1946-01-18 DOA: 07/20/2017  PCP: Curlene Labrum, MD  Admit date: 07/20/2017 Discharge date: 07/22/2017  Admitted From: Home  Disposition:  Home   Recommendations for Outpatient Follow-up:  1. Follow up with PCP in 1 weeks 2. Please obtain BMP/CBC in one week 3. Please obtain follow up CT chest in 6-12 months to evaluate lung nodule per recommendations below, if not already planned  Home Health: None Equipment/Devices: Home oxygen  Discharge Condition: Fair CODE STATUS: Full Diet recommendation: Regular  Brief/Interim Summary: Leonard Lane is a 72 yo M with hx recent PE on anticoagulation, hx prostate CA, COPD on O2 at noct who presents with dyspnea and increased O2 requirement.      1. COPD exacerbation Acute on chronic respiratory fialure with hypoxia Patient presented with increased work of breathing and requiring 4L O2 to maintain SpO2>90.  Wheezing on exam, afebrile, CXR and follow up CTA chest negative for pneumonia or PE. Was treated with steroids and nebs and felt considerably better, and eager to go home. -Discharged on new oxygen and prolonged steroid taper  2. Recent pulmonary embolism Xarelto was continued  3. Iron deficiency anemia, chronic Hemoglobin stable, iron continued.  4. Sinus tachycardia This was noted at times, thought to be related to SABA use.  Was asymptomatic.      Discharge Diagnoses:  Principal Problem:   COPD with acute exacerbation ([place]) Active Problems:   Pulmonary embolism (HCC)   Acute on chronic respiratory failure with hypoxia (HCC)   Prostate cancer (HCC)   GERD (gastroesophageal reflux disease)   Iron deficiency anemia   RBBB   Inappropriate sinus tachycardia   Nodule of lower lobe of right lung    Discharge Instructions  Discharge Instructions    Diet - low sodium heart healthy   Complete by:  As directed    Discharge instructions   Complete by:  As  directed    From Dr. Loleta Books: You were admitted for trouble breathing from a COPD flare.   Your CT scan showed no evidence of pneumonia or pulmonary embolism, and you improved with steroids and albuterol. You should continue these as below:  Use oxygen while walking until you see Dr. Pleas Koch (he may be able to re-evaluate you and take it off, or he may choose to leave it on until you see your pulmonologist/Lung doctor).  Take prednisone according to the following taper:    Take prednisone 60 mg (6 tabs) daily in the morning for two days, then    Take prednisone 50 mg (5 tabs) daily in the morning for two days, then    Take prednisone 40 mg (4 tabs) daily in the morning for two days, then    Take prednisone 30 mg (3 tabs) daily in the morning for two days, then    Take prednisone 20 mg (2 tabs) daily in the morning for two days, then    Take prednisone 10 mg (1 tabs) daily in the morning for two days, then stop   Use albuterol either in the form of Proventil inhaler or in the form of albuterol in the nebulizer.  You can use this up to every 4 hours.  Call your primary care doctor, Dr. Pleas Koch for a follow up next week.  Return to the hospital or call Dr. Lizbeth Bark office if you have trouble breathing, feel dizzy with walking, have a few fever, or have new chest pain.   Increase activity slowly  Complete by:  As directed      Allergies as of 07/21/2017      Reactions   Sulfa Antibiotics Nausea Only   GI upset      Medication List    TAKE these medications   acetaminophen 500 MG tablet Commonly known as:  TYLENOL Take 500 mg by mouth every 6 (six) hours as needed for mild pain or headache.   albuterol 108 (90 Base) MCG/ACT inhaler Commonly known as:  PROVENTIL HFA;VENTOLIN HFA Inhale 1 puff into the lungs every 6 (six) hours as needed for wheezing or shortness of breath. What changed:  Another medication with the same name was added. Make sure you understand how and when to  take each.   albuterol (2.5 MG/3ML) 0.083% nebulizer solution Commonly known as:  PROVENTIL Take 3 mLs (2.5 mg total) by nebulization every 6 (six) hours as needed for wheezing or shortness of breath. What changed:  You were already taking a medication with the same name, and this prescription was added. Make sure you understand how and when to take each.   benzonatate 100 MG capsule Commonly known as:  TESSALON Take 100 mg by mouth 3 (three) times daily as needed for cough.   budesonide-formoterol 160-4.5 MCG/ACT inhaler Commonly known as:  SYMBICORT Inhale 2 puffs into the lungs 2 (two) times daily.   cholecalciferol 1000 units tablet Commonly known as:  VITAMIN D Take 1,000 Units by mouth daily.   finasteride 5 MG tablet Commonly known as:  PROSCAR Take 5 mg by mouth daily.   IRON 27 PO Take 27 mg by mouth daily.   loratadine 10 MG tablet Commonly known as:  CLARITIN Take 10 mg by mouth daily.   montelukast 10 MG tablet Commonly known as:  SINGULAIR Take 10 mg by mouth at bedtime.   predniSONE 10 MG tablet Commonly known as:  DELTASONE Take 6 tablets (60 mg total) by mouth daily for 2 days, THEN 5 tablets (50 mg total) daily for 2 days, THEN 4 tablets (40 mg total) daily for 2 days, THEN 3 tablets (30 mg total) daily for 2 days, THEN 2 tablets (20 mg total) daily for 2 days, THEN 1 tablet (10 mg total) daily for 2 days. Start taking on:  07/21/2017   ranitidine 150 MG tablet Commonly known as:  ZANTAC Take 150 mg by mouth 2 (two) times daily.   rivaroxaban 20 MG Tabs tablet Commonly known as:  XARELTO Take 20 mg by mouth daily at 6 (six) AM.   tamsulosin 0.4 MG Caps capsule Commonly known as:  FLOMAX Take 0.4 mg by mouth daily.      Follow-up Information    Burdine, Virgina Evener, MD On 07/30/2017.   Specialty:  Family Medicine Why:  At 11:00 AM. Contact information: [place] [address] [phone]          Allergies  Allergen Reactions  .  Sulfa Antibiotics Nausea Only    GI upset    Consultations:  None   Procedures/Studies: Ct Angio Chest Pe W Or Wo Contrast  Result Date: 07/20/2017 CLINICAL DATA:  Shortness of breath x6 days, evaluate for PE EXAM: CT ANGIOGRAPHY CHEST WITH CONTRAST TECHNIQUE: Multidetector CT imaging of the chest was performed using the standard protocol during bolus administration of intravenous contrast. Multiplanar CT image reconstructions and MIPs were obtained to evaluate the vascular anatomy. CONTRAST:  146mL ISOVUE-370 IOPAMIDOL (ISOVUE-370) INJECTION 76% COMPARISON:  Chest radiograph dated 07/20/2017 FINDINGS: Cardiovascular: Satisfactory opacification  of the bilateral pulmonary artery to the segmental level. Evaluation the bilateral lower lobe pulmonary arteries is mildly constrained by respiratory motion. No evidence of pulmonary embolism. No evidence of thoracic aortic aneurysm or dissection. Atherosclerotic calcifications of the aortic arch. The heart is normal in size.  No pericardial effusion. Three vessel coronary atherosclerosis. Mediastinum/Nodes: No suspicious mediastinal lymphadenopathy. Visualized thyroid is unremarkable. Lungs/Pleura: Evaluation of the lung parenchyma is constrained by respiratory motion. Mild subpleural nodular scarring in the medial left lower lobe (series 7/image 46). 6 mm right lower lobe nodule (series 7/image 77). Moderate centrilobular and paraseptal emphysematous changes, upper lobe predominant. Mild right lower lobe scarring/atelectasis. No focal consolidation. No pleural effusion or pneumothorax. Upper Abdomen: Visualized upper abdomen is notable for a 2.7 cm right upper pole renal cyst. Musculoskeletal: Visualized osseous structures are within normal limits. Review of the MIP images confirms the above findings. IMPRESSION: No evidence of pulmonary embolism. 6 mm right lower lobe pulmonary nodule. Non-contrast chest CT at 6-12 months is recommended. If the nodule is stable  at time of repeat CT, then future CT at 18-24 months (from today's scan) is recommended for this high-risk patient. This recommendation follows the consensus statement: Guidelines for Management of Incidental Pulmonary Nodules Detected on CT Images: From the Fleischner Society 2017; Radiology 2017; 284:228-243. Aortic Atherosclerosis (ICD10-I70.0) and Emphysema (ICD10-J43.9). Electronically Signed   By: Julian Hy M.D.   On: 07/20/2017 10:10   Dg Chest Port 1 View  Result Date: 07/20/2017 CLINICAL DATA:  72 year old male with shortness of breath. EXAM: PORTABLE CHEST 1 VIEW COMPARISON:  None. FINDINGS: Probable mild emphysematous changes. No focal consolidation, pleural effusion, or pneumothorax. The cardiac silhouette is within normal limits. No acute osseous pathology. IMPRESSION: No active disease. Electronically Signed   By: Anner Crete M.D.   On: 07/20/2017 06:47       Subjective: Patient feeling well.  Dyspnea improved, able to ambulate at his baseline.  No cough, fever, chest pain, confusion, weakness.  Discharge Exam: Vitals:   07/21/17 0816 07/21/17 1144  BP:  120/62  Pulse: 94 100  Resp: 18 18  Temp:  97.7 F (36.5 C)  SpO2: 93% 90%   Vitals:   07/21/17 0552 07/21/17 0810 07/21/17 0816 07/21/17 1144  BP: 103/60 115/68  120/62  Pulse: 90 74 94 100  Resp: 18  18 18   Temp: 98.2 F (36.8 C)   97.7 F (36.5 C)  TempSrc: Oral   Oral  SpO2: 96% 95% 93% 90%  Weight: 64.4 kg (141 lb 14.4 oz)     Height:        General: Pt is alert, awake, not in acute distress Cardiovascular: RRR, S1/S2 +, no rubs, no gallops Respiratory: Scattered wheezing,normal effort, no rales Abdominal: Soft, NT, ND, bowel sounds + Extremities: no edema, no cyanosis    The results of significant diagnostics from this hospitalization (including imaging, microbiology, ancillary and laboratory) are listed below for reference.     Microbiology: No results found for this or any previous  visit (from the past 240 hour(s)).   Labs: BNP (last 3 results) Recent Labs    07/20/17 0454  BNP 58.5   Basic Metabolic Panel: Recent Labs  Lab 07/20/17 0454  NA 139  K 3.9  CL 103  CO2 24  GLUCOSE 171*  BUN 6  CREATININE 0.82  CALCIUM 8.9   Liver Function Tests: Recent Labs  Lab 07/20/17 0454  AST 24  ALT 25  ALKPHOS 70  BILITOT 0.6  PROT 7.1  ALBUMIN 3.3*   No results for input(s): LIPASE, AMYLASE in the last 168 hours. No results for input(s): AMMONIA in the last 168 hours. CBC: Recent Labs  Lab 07/20/17 0454  WBC 8.7  NEUTROABS 7.6  HGB 12.6*  HCT 37.6*  MCV 87.4  PLT 305   Cardiac Enzymes: Recent Labs  Lab 07/20/17 0454  TROPONINI <0.03   BNP: Invalid input(s): POCBNP CBG: No results for input(s): GLUCAP in the last 168 hours. D-Dimer No results for input(s): DDIMER in the last 72 hours. Hgb A1c No results for input(s): HGBA1C in the last 72 hours. Lipid Profile No results for input(s): CHOL, HDL, LDLCALC, TRIG, CHOLHDL, LDLDIRECT in the last 72 hours. Thyroid function studies No results for input(s): TSH, T4TOTAL, T3FREE, THYROIDAB in the last 72 hours.  Invalid input(s): FREET3 Anemia work up No results for input(s): VITAMINB12, FOLATE, FERRITIN, TIBC, IRON, RETICCTPCT in the last 72 hours. Urinalysis No results found for: COLORURINE, APPEARANCEUR, LABSPEC, [place], GLUCOSEU, HGBUR, BILIRUBINUR, KETONESUR, PROTEINUR, UROBILINOGEN, NITRITE, LEUKOCYTESUR Sepsis Labs Invalid input(s): PROCALCITONIN,  WBC,  LACTICIDVEN Microbiology No results found for this or any previous visit (from the past 240 hour(s)).   Time coordinating discharge: Over 30 minutes  SIGNED:   Edwin Dada, MD  [facility] 07/22/2017, 5:47 AM

## 2017-07-23 DIAGNOSIS — Z7401 Bed confinement status: Secondary | ICD-10-CM | POA: Diagnosis not present

## 2017-07-23 DIAGNOSIS — R069 Unspecified abnormalities of breathing: Secondary | ICD-10-CM | POA: Diagnosis not present

## 2017-07-23 DIAGNOSIS — I517 Cardiomegaly: Secondary | ICD-10-CM | POA: Diagnosis not present

## 2017-07-23 DIAGNOSIS — R5381 Other malaise: Secondary | ICD-10-CM | POA: Diagnosis not present

## 2017-07-23 DIAGNOSIS — R079 Chest pain, unspecified: Secondary | ICD-10-CM | POA: Diagnosis not present

## 2017-07-23 DIAGNOSIS — R0600 Dyspnea, unspecified: Secondary | ICD-10-CM | POA: Diagnosis not present

## 2017-07-23 DIAGNOSIS — Z9109 Other allergy status, other than to drugs and biological substances: Secondary | ICD-10-CM | POA: Diagnosis not present

## 2017-07-23 DIAGNOSIS — R0602 Shortness of breath: Secondary | ICD-10-CM | POA: Diagnosis not present

## 2017-07-23 DIAGNOSIS — Z91018 Allergy to other foods: Secondary | ICD-10-CM | POA: Diagnosis not present

## 2017-07-23 DIAGNOSIS — D72829 Elevated white blood cell count, unspecified: Secondary | ICD-10-CM | POA: Diagnosis not present

## 2017-07-23 DIAGNOSIS — R0603 Acute respiratory distress: Secondary | ICD-10-CM | POA: Diagnosis not present

## 2017-07-23 DIAGNOSIS — R000 Tachycardia, unspecified: Secondary | ICD-10-CM | POA: Diagnosis not present

## 2017-07-23 DIAGNOSIS — Z882 Allergy status to sulfonamides status: Secondary | ICD-10-CM | POA: Diagnosis not present

## 2017-07-23 DIAGNOSIS — E871 Hypo-osmolality and hyponatremia: Secondary | ICD-10-CM | POA: Diagnosis not present

## 2017-07-23 DIAGNOSIS — I10 Essential (primary) hypertension: Secondary | ICD-10-CM | POA: Diagnosis not present

## 2017-07-23 DIAGNOSIS — J449 Chronic obstructive pulmonary disease, unspecified: Secondary | ICD-10-CM | POA: Diagnosis not present

## 2017-07-23 DIAGNOSIS — Z91041 Radiographic dye allergy status: Secondary | ICD-10-CM | POA: Diagnosis not present

## 2017-07-23 DIAGNOSIS — K219 Gastro-esophageal reflux disease without esophagitis: Secondary | ICD-10-CM | POA: Diagnosis present

## 2017-07-23 DIAGNOSIS — R739 Hyperglycemia, unspecified: Secondary | ICD-10-CM | POA: Diagnosis present

## 2017-07-23 DIAGNOSIS — J9601 Acute respiratory failure with hypoxia: Secondary | ICD-10-CM | POA: Diagnosis not present

## 2017-07-23 DIAGNOSIS — Z8546 Personal history of malignant neoplasm of prostate: Secondary | ICD-10-CM | POA: Diagnosis not present

## 2017-07-23 DIAGNOSIS — I219 Acute myocardial infarction, unspecified: Secondary | ICD-10-CM | POA: Diagnosis not present

## 2017-07-23 DIAGNOSIS — Z885 Allergy status to narcotic agent status: Secondary | ICD-10-CM | POA: Diagnosis not present

## 2017-07-23 DIAGNOSIS — Z889 Allergy status to unspecified drugs, medicaments and biological substances status: Secondary | ICD-10-CM | POA: Diagnosis not present

## 2017-07-23 DIAGNOSIS — Z886 Allergy status to analgesic agent status: Secondary | ICD-10-CM | POA: Diagnosis not present

## 2017-07-23 DIAGNOSIS — C61 Malignant neoplasm of prostate: Secondary | ICD-10-CM | POA: Diagnosis present

## 2017-07-23 DIAGNOSIS — Z888 Allergy status to other drugs, medicaments and biological substances status: Secondary | ICD-10-CM | POA: Diagnosis not present

## 2017-07-23 DIAGNOSIS — Z884 Allergy status to anesthetic agent status: Secondary | ICD-10-CM | POA: Diagnosis not present

## 2017-07-23 DIAGNOSIS — R9431 Abnormal electrocardiogram [ECG] [EKG]: Secondary | ICD-10-CM | POA: Diagnosis not present

## 2017-07-23 DIAGNOSIS — R05 Cough: Secondary | ICD-10-CM | POA: Diagnosis not present

## 2017-07-23 DIAGNOSIS — Z833 Family history of diabetes mellitus: Secondary | ICD-10-CM | POA: Diagnosis not present

## 2017-07-23 DIAGNOSIS — I700 Atherosclerosis of aorta: Secondary | ICD-10-CM | POA: Diagnosis not present

## 2017-07-23 DIAGNOSIS — Z91048 Other nonmedicinal substance allergy status: Secondary | ICD-10-CM | POA: Diagnosis not present

## 2017-07-23 DIAGNOSIS — R110 Nausea: Secondary | ICD-10-CM | POA: Diagnosis not present

## 2017-07-23 DIAGNOSIS — Z87891 Personal history of nicotine dependence: Secondary | ICD-10-CM | POA: Diagnosis not present

## 2017-07-23 DIAGNOSIS — Z86711 Personal history of pulmonary embolism: Secondary | ICD-10-CM | POA: Diagnosis not present

## 2017-07-23 DIAGNOSIS — I429 Cardiomyopathy, unspecified: Secondary | ICD-10-CM | POA: Diagnosis not present

## 2017-07-23 DIAGNOSIS — I428 Other cardiomyopathies: Secondary | ICD-10-CM | POA: Diagnosis not present

## 2017-07-23 DIAGNOSIS — Z881 Allergy status to other antibiotic agents status: Secondary | ICD-10-CM | POA: Diagnosis not present

## 2017-07-23 DIAGNOSIS — D649 Anemia, unspecified: Secondary | ICD-10-CM | POA: Diagnosis present

## 2017-07-23 DIAGNOSIS — Z923 Personal history of irradiation: Secondary | ICD-10-CM | POA: Diagnosis not present

## 2017-07-23 DIAGNOSIS — J441 Chronic obstructive pulmonary disease with (acute) exacerbation: Secondary | ICD-10-CM | POA: Diagnosis not present

## 2017-07-23 DIAGNOSIS — R531 Weakness: Secondary | ICD-10-CM | POA: Diagnosis not present

## 2017-07-23 DIAGNOSIS — J80 Acute respiratory distress syndrome: Secondary | ICD-10-CM | POA: Diagnosis not present

## 2017-07-23 DIAGNOSIS — R918 Other nonspecific abnormal finding of lung field: Secondary | ICD-10-CM | POA: Diagnosis not present

## 2017-07-23 DIAGNOSIS — I4510 Unspecified right bundle-branch block: Secondary | ICD-10-CM | POA: Diagnosis not present

## 2017-07-23 DIAGNOSIS — I2699 Other pulmonary embolism without acute cor pulmonale: Secondary | ICD-10-CM | POA: Diagnosis not present

## 2017-07-23 DIAGNOSIS — I255 Ischemic cardiomyopathy: Secondary | ICD-10-CM | POA: Diagnosis present

## 2017-08-03 DIAGNOSIS — R911 Solitary pulmonary nodule: Secondary | ICD-10-CM | POA: Diagnosis not present

## 2017-08-03 DIAGNOSIS — J9621 Acute and chronic respiratory failure with hypoxia: Secondary | ICD-10-CM | POA: Diagnosis not present

## 2017-08-03 DIAGNOSIS — I2699 Other pulmonary embolism without acute cor pulmonale: Secondary | ICD-10-CM | POA: Diagnosis not present

## 2017-08-03 DIAGNOSIS — J439 Emphysema, unspecified: Secondary | ICD-10-CM | POA: Diagnosis not present

## 2017-08-03 DIAGNOSIS — E782 Mixed hyperlipidemia: Secondary | ICD-10-CM | POA: Diagnosis not present

## 2017-08-03 DIAGNOSIS — J9601 Acute respiratory failure with hypoxia: Secondary | ICD-10-CM | POA: Diagnosis not present

## 2017-08-03 DIAGNOSIS — I5021 Acute systolic (congestive) heart failure: Secondary | ICD-10-CM | POA: Diagnosis not present

## 2017-08-03 DIAGNOSIS — J440 Chronic obstructive pulmonary disease with acute lower respiratory infection: Secondary | ICD-10-CM | POA: Diagnosis not present

## 2017-08-03 DIAGNOSIS — D509 Iron deficiency anemia, unspecified: Secondary | ICD-10-CM | POA: Diagnosis not present

## 2017-08-03 DIAGNOSIS — Z87891 Personal history of nicotine dependence: Secondary | ICD-10-CM | POA: Diagnosis not present

## 2017-08-03 DIAGNOSIS — R0600 Dyspnea, unspecified: Secondary | ICD-10-CM | POA: Diagnosis not present

## 2017-08-03 DIAGNOSIS — J302 Other seasonal allergic rhinitis: Secondary | ICD-10-CM | POA: Diagnosis not present

## 2017-08-05 DIAGNOSIS — I10 Essential (primary) hypertension: Secondary | ICD-10-CM | POA: Diagnosis present

## 2017-08-05 DIAGNOSIS — Z8546 Personal history of malignant neoplasm of prostate: Secondary | ICD-10-CM | POA: Diagnosis not present

## 2017-08-05 DIAGNOSIS — K5651 Intestinal adhesions [bands], with partial obstruction: Secondary | ICD-10-CM | POA: Diagnosis present

## 2017-08-05 DIAGNOSIS — I2699 Other pulmonary embolism without acute cor pulmonale: Secondary | ICD-10-CM | POA: Diagnosis not present

## 2017-08-05 DIAGNOSIS — R1084 Generalized abdominal pain: Secondary | ICD-10-CM | POA: Diagnosis not present

## 2017-08-05 DIAGNOSIS — J441 Chronic obstructive pulmonary disease with (acute) exacerbation: Secondary | ICD-10-CM | POA: Diagnosis present

## 2017-08-05 DIAGNOSIS — Z7901 Long term (current) use of anticoagulants: Secondary | ICD-10-CM | POA: Diagnosis not present

## 2017-08-05 DIAGNOSIS — D72829 Elevated white blood cell count, unspecified: Secondary | ICD-10-CM | POA: Diagnosis not present

## 2017-08-05 DIAGNOSIS — J209 Acute bronchitis, unspecified: Secondary | ICD-10-CM | POA: Diagnosis not present

## 2017-08-05 DIAGNOSIS — K8000 Calculus of gallbladder with acute cholecystitis without obstruction: Secondary | ICD-10-CM | POA: Diagnosis not present

## 2017-08-05 DIAGNOSIS — E860 Dehydration: Secondary | ICD-10-CM | POA: Diagnosis not present

## 2017-08-05 DIAGNOSIS — K56609 Unspecified intestinal obstruction, unspecified as to partial versus complete obstruction: Secondary | ICD-10-CM | POA: Diagnosis not present

## 2017-08-05 DIAGNOSIS — E876 Hypokalemia: Secondary | ICD-10-CM | POA: Diagnosis present

## 2017-08-05 DIAGNOSIS — N400 Enlarged prostate without lower urinary tract symptoms: Secondary | ICD-10-CM | POA: Diagnosis present

## 2017-08-05 DIAGNOSIS — M6281 Muscle weakness (generalized): Secondary | ICD-10-CM | POA: Diagnosis not present

## 2017-08-05 DIAGNOSIS — J440 Chronic obstructive pulmonary disease with acute lower respiratory infection: Secondary | ICD-10-CM | POA: Diagnosis present

## 2017-08-05 DIAGNOSIS — E878 Other disorders of electrolyte and fluid balance, not elsewhere classified: Secondary | ICD-10-CM | POA: Diagnosis present

## 2017-08-05 DIAGNOSIS — Z01810 Encounter for preprocedural cardiovascular examination: Secondary | ICD-10-CM | POA: Diagnosis not present

## 2017-08-05 DIAGNOSIS — R0602 Shortness of breath: Secondary | ICD-10-CM | POA: Diagnosis not present

## 2017-08-05 DIAGNOSIS — Z86711 Personal history of pulmonary embolism: Secondary | ICD-10-CM | POA: Diagnosis not present

## 2017-08-05 DIAGNOSIS — R112 Nausea with vomiting, unspecified: Secondary | ICD-10-CM | POA: Diagnosis not present

## 2017-08-05 DIAGNOSIS — K219 Gastro-esophageal reflux disease without esophagitis: Secondary | ICD-10-CM | POA: Diagnosis present

## 2017-08-05 DIAGNOSIS — R0989 Other specified symptoms and signs involving the circulatory and respiratory systems: Secondary | ICD-10-CM | POA: Diagnosis not present

## 2017-08-05 DIAGNOSIS — K6389 Other specified diseases of intestine: Secondary | ICD-10-CM | POA: Diagnosis not present

## 2017-08-05 DIAGNOSIS — R933 Abnormal findings on diagnostic imaging of other parts of digestive tract: Secondary | ICD-10-CM | POA: Diagnosis not present

## 2017-08-05 DIAGNOSIS — K5669 Other partial intestinal obstruction: Secondary | ICD-10-CM | POA: Diagnosis not present

## 2017-08-05 DIAGNOSIS — J449 Chronic obstructive pulmonary disease, unspecified: Secondary | ICD-10-CM | POA: Diagnosis not present

## 2017-08-05 DIAGNOSIS — J40 Bronchitis, not specified as acute or chronic: Secondary | ICD-10-CM | POA: Diagnosis not present

## 2017-08-05 DIAGNOSIS — Z882 Allergy status to sulfonamides status: Secondary | ICD-10-CM | POA: Diagnosis not present

## 2017-08-05 DIAGNOSIS — J189 Pneumonia, unspecified organism: Secondary | ICD-10-CM | POA: Diagnosis not present

## 2017-08-05 DIAGNOSIS — R000 Tachycardia, unspecified: Secondary | ICD-10-CM | POA: Diagnosis present

## 2017-08-14 DIAGNOSIS — E860 Dehydration: Secondary | ICD-10-CM | POA: Diagnosis not present

## 2017-08-14 DIAGNOSIS — M6281 Muscle weakness (generalized): Secondary | ICD-10-CM | POA: Diagnosis not present

## 2017-08-14 DIAGNOSIS — Z9981 Dependence on supplemental oxygen: Secondary | ICD-10-CM | POA: Diagnosis not present

## 2017-08-14 DIAGNOSIS — Z86711 Personal history of pulmonary embolism: Secondary | ICD-10-CM | POA: Diagnosis not present

## 2017-08-14 DIAGNOSIS — Z79899 Other long term (current) drug therapy: Secondary | ICD-10-CM | POA: Diagnosis not present

## 2017-08-14 DIAGNOSIS — K56609 Unspecified intestinal obstruction, unspecified as to partial versus complete obstruction: Secondary | ICD-10-CM | POA: Diagnosis not present

## 2017-08-14 DIAGNOSIS — K8000 Calculus of gallbladder with acute cholecystitis without obstruction: Secondary | ICD-10-CM | POA: Diagnosis not present

## 2017-08-14 DIAGNOSIS — Z882 Allergy status to sulfonamides status: Secondary | ICD-10-CM | POA: Diagnosis not present

## 2017-08-14 DIAGNOSIS — R933 Abnormal findings on diagnostic imaging of other parts of digestive tract: Secondary | ICD-10-CM | POA: Diagnosis not present

## 2017-08-14 DIAGNOSIS — I2699 Other pulmonary embolism without acute cor pulmonale: Secondary | ICD-10-CM | POA: Diagnosis not present

## 2017-08-14 DIAGNOSIS — R000 Tachycardia, unspecified: Secondary | ICD-10-CM | POA: Diagnosis not present

## 2017-08-14 DIAGNOSIS — R4182 Altered mental status, unspecified: Secondary | ICD-10-CM | POA: Diagnosis not present

## 2017-08-14 DIAGNOSIS — D649 Anemia, unspecified: Secondary | ICD-10-CM | POA: Diagnosis not present

## 2017-08-14 DIAGNOSIS — R0902 Hypoxemia: Secondary | ICD-10-CM | POA: Diagnosis not present

## 2017-08-14 DIAGNOSIS — J209 Acute bronchitis, unspecified: Secondary | ICD-10-CM | POA: Diagnosis not present

## 2017-08-14 DIAGNOSIS — I10 Essential (primary) hypertension: Secondary | ICD-10-CM | POA: Diagnosis not present

## 2017-08-14 DIAGNOSIS — R0689 Other abnormalities of breathing: Secondary | ICD-10-CM | POA: Diagnosis not present

## 2017-08-14 DIAGNOSIS — J441 Chronic obstructive pulmonary disease with (acute) exacerbation: Secondary | ICD-10-CM | POA: Diagnosis not present

## 2017-08-14 DIAGNOSIS — Z7902 Long term (current) use of antithrombotics/antiplatelets: Secondary | ICD-10-CM | POA: Diagnosis not present

## 2017-08-14 DIAGNOSIS — R0602 Shortness of breath: Secondary | ICD-10-CM | POA: Diagnosis not present

## 2017-08-14 DIAGNOSIS — R112 Nausea with vomiting, unspecified: Secondary | ICD-10-CM | POA: Diagnosis not present

## 2017-08-14 DIAGNOSIS — J189 Pneumonia, unspecified organism: Secondary | ICD-10-CM | POA: Diagnosis not present

## 2017-08-14 DIAGNOSIS — J449 Chronic obstructive pulmonary disease, unspecified: Secondary | ICD-10-CM | POA: Diagnosis not present

## 2017-08-14 DIAGNOSIS — K5669 Other partial intestinal obstruction: Secondary | ICD-10-CM | POA: Diagnosis not present

## 2017-08-14 DIAGNOSIS — R1084 Generalized abdominal pain: Secondary | ICD-10-CM | POA: Diagnosis not present

## 2017-08-18 DIAGNOSIS — R000 Tachycardia, unspecified: Secondary | ICD-10-CM | POA: Diagnosis not present

## 2017-08-18 DIAGNOSIS — J441 Chronic obstructive pulmonary disease with (acute) exacerbation: Secondary | ICD-10-CM | POA: Diagnosis not present

## 2017-08-18 DIAGNOSIS — Z9981 Dependence on supplemental oxygen: Secondary | ICD-10-CM | POA: Diagnosis not present

## 2017-08-18 DIAGNOSIS — J449 Chronic obstructive pulmonary disease, unspecified: Secondary | ICD-10-CM | POA: Diagnosis not present

## 2017-08-18 DIAGNOSIS — R0902 Hypoxemia: Secondary | ICD-10-CM | POA: Diagnosis not present

## 2017-08-18 DIAGNOSIS — E860 Dehydration: Secondary | ICD-10-CM | POA: Diagnosis not present

## 2017-08-18 DIAGNOSIS — Z86711 Personal history of pulmonary embolism: Secondary | ICD-10-CM | POA: Diagnosis not present

## 2017-08-18 DIAGNOSIS — I10 Essential (primary) hypertension: Secondary | ICD-10-CM | POA: Diagnosis not present

## 2017-08-18 DIAGNOSIS — R0602 Shortness of breath: Secondary | ICD-10-CM | POA: Diagnosis not present

## 2017-08-19 DIAGNOSIS — M6281 Muscle weakness (generalized): Secondary | ICD-10-CM | POA: Diagnosis not present

## 2017-08-19 DIAGNOSIS — J9621 Acute and chronic respiratory failure with hypoxia: Secondary | ICD-10-CM | POA: Diagnosis not present

## 2017-08-19 DIAGNOSIS — E860 Dehydration: Secondary | ICD-10-CM | POA: Diagnosis not present

## 2017-08-19 DIAGNOSIS — J449 Chronic obstructive pulmonary disease, unspecified: Secondary | ICD-10-CM | POA: Diagnosis not present

## 2017-08-19 DIAGNOSIS — Z7409 Other reduced mobility: Secondary | ICD-10-CM | POA: Diagnosis not present

## 2017-08-19 DIAGNOSIS — I5021 Acute systolic (congestive) heart failure: Secondary | ICD-10-CM | POA: Diagnosis not present

## 2017-08-19 DIAGNOSIS — Z9981 Dependence on supplemental oxygen: Secondary | ICD-10-CM | POA: Diagnosis not present

## 2017-08-19 DIAGNOSIS — J9601 Acute respiratory failure with hypoxia: Secondary | ICD-10-CM | POA: Diagnosis not present

## 2017-08-19 DIAGNOSIS — R000 Tachycardia, unspecified: Secondary | ICD-10-CM | POA: Diagnosis not present

## 2017-08-19 DIAGNOSIS — J439 Emphysema, unspecified: Secondary | ICD-10-CM | POA: Diagnosis not present

## 2017-08-19 DIAGNOSIS — R0602 Shortness of breath: Secondary | ICD-10-CM | POA: Diagnosis not present

## 2017-08-19 DIAGNOSIS — I10 Essential (primary) hypertension: Secondary | ICD-10-CM | POA: Diagnosis not present

## 2017-08-19 DIAGNOSIS — J440 Chronic obstructive pulmonary disease with acute lower respiratory infection: Secondary | ICD-10-CM | POA: Diagnosis not present

## 2017-08-19 DIAGNOSIS — J441 Chronic obstructive pulmonary disease with (acute) exacerbation: Secondary | ICD-10-CM | POA: Diagnosis not present

## 2017-08-19 DIAGNOSIS — R911 Solitary pulmonary nodule: Secondary | ICD-10-CM | POA: Diagnosis not present

## 2017-08-19 DIAGNOSIS — I2699 Other pulmonary embolism without acute cor pulmonale: Secondary | ICD-10-CM | POA: Diagnosis not present

## 2017-08-19 DIAGNOSIS — R0600 Dyspnea, unspecified: Secondary | ICD-10-CM | POA: Diagnosis not present

## 2017-08-19 DIAGNOSIS — Z86711 Personal history of pulmonary embolism: Secondary | ICD-10-CM | POA: Diagnosis not present

## 2017-08-21 DIAGNOSIS — J439 Emphysema, unspecified: Secondary | ICD-10-CM | POA: Diagnosis not present

## 2017-08-21 DIAGNOSIS — R911 Solitary pulmonary nodule: Secondary | ICD-10-CM | POA: Diagnosis not present

## 2017-08-21 DIAGNOSIS — J440 Chronic obstructive pulmonary disease with acute lower respiratory infection: Secondary | ICD-10-CM | POA: Diagnosis not present

## 2017-08-21 DIAGNOSIS — I5021 Acute systolic (congestive) heart failure: Secondary | ICD-10-CM | POA: Diagnosis not present

## 2017-08-21 DIAGNOSIS — J9601 Acute respiratory failure with hypoxia: Secondary | ICD-10-CM | POA: Diagnosis not present

## 2017-08-21 DIAGNOSIS — J9621 Acute and chronic respiratory failure with hypoxia: Secondary | ICD-10-CM | POA: Diagnosis not present

## 2017-08-21 DIAGNOSIS — I2699 Other pulmonary embolism without acute cor pulmonale: Secondary | ICD-10-CM | POA: Diagnosis not present

## 2017-08-21 DIAGNOSIS — R0600 Dyspnea, unspecified: Secondary | ICD-10-CM | POA: Diagnosis not present

## 2017-09-04 DIAGNOSIS — S0001XA Abrasion of scalp, initial encounter: Secondary | ICD-10-CM | POA: Diagnosis not present

## 2017-09-04 DIAGNOSIS — W010XXA Fall on same level from slipping, tripping and stumbling without subsequent striking against object, initial encounter: Secondary | ICD-10-CM | POA: Diagnosis not present

## 2017-09-04 DIAGNOSIS — S0080XA Unspecified superficial injury of other part of head, initial encounter: Secondary | ICD-10-CM | POA: Diagnosis not present

## 2017-09-04 DIAGNOSIS — Z79899 Other long term (current) drug therapy: Secondary | ICD-10-CM | POA: Diagnosis not present

## 2017-09-04 DIAGNOSIS — S0093XA Contusion of unspecified part of head, initial encounter: Secondary | ICD-10-CM | POA: Diagnosis not present

## 2017-09-04 DIAGNOSIS — Z882 Allergy status to sulfonamides status: Secondary | ICD-10-CM | POA: Diagnosis not present

## 2017-09-04 DIAGNOSIS — S0090XA Unspecified superficial injury of unspecified part of head, initial encounter: Secondary | ICD-10-CM | POA: Diagnosis not present

## 2017-09-04 DIAGNOSIS — Z87891 Personal history of nicotine dependence: Secondary | ICD-10-CM | POA: Diagnosis not present

## 2017-09-04 DIAGNOSIS — S1980XA Other specified injuries of unspecified part of neck, initial encounter: Secondary | ICD-10-CM | POA: Diagnosis not present

## 2017-09-10 DIAGNOSIS — N401 Enlarged prostate with lower urinary tract symptoms: Secondary | ICD-10-CM | POA: Diagnosis not present

## 2017-09-10 DIAGNOSIS — R3912 Poor urinary stream: Secondary | ICD-10-CM | POA: Diagnosis not present

## 2017-09-10 DIAGNOSIS — R350 Frequency of micturition: Secondary | ICD-10-CM | POA: Diagnosis not present

## 2017-09-10 DIAGNOSIS — Z8546 Personal history of malignant neoplasm of prostate: Secondary | ICD-10-CM | POA: Diagnosis not present

## 2017-09-13 DIAGNOSIS — J9621 Acute and chronic respiratory failure with hypoxia: Secondary | ICD-10-CM | POA: Diagnosis not present

## 2017-09-13 DIAGNOSIS — F17211 Nicotine dependence, cigarettes, in remission: Secondary | ICD-10-CM | POA: Diagnosis not present

## 2017-09-13 DIAGNOSIS — C61 Malignant neoplasm of prostate: Secondary | ICD-10-CM | POA: Diagnosis not present

## 2017-09-13 DIAGNOSIS — J439 Emphysema, unspecified: Secondary | ICD-10-CM | POA: Diagnosis not present

## 2017-09-13 DIAGNOSIS — D509 Iron deficiency anemia, unspecified: Secondary | ICD-10-CM | POA: Diagnosis not present

## 2017-09-13 DIAGNOSIS — Z681 Body mass index (BMI) 19 or less, adult: Secondary | ICD-10-CM | POA: Diagnosis not present

## 2017-09-13 DIAGNOSIS — E782 Mixed hyperlipidemia: Secondary | ICD-10-CM | POA: Diagnosis not present

## 2017-09-13 DIAGNOSIS — I2699 Other pulmonary embolism without acute cor pulmonale: Secondary | ICD-10-CM | POA: Diagnosis not present

## 2017-09-21 DIAGNOSIS — R937 Abnormal findings on diagnostic imaging of other parts of musculoskeletal system: Secondary | ICD-10-CM | POA: Diagnosis not present

## 2017-09-21 DIAGNOSIS — M79632 Pain in left forearm: Secondary | ICD-10-CM | POA: Diagnosis not present

## 2017-09-21 DIAGNOSIS — M79602 Pain in left arm: Secondary | ICD-10-CM | POA: Diagnosis not present

## 2017-09-23 DIAGNOSIS — J9610 Chronic respiratory failure, unspecified whether with hypoxia or hypercapnia: Secondary | ICD-10-CM | POA: Diagnosis not present

## 2017-09-23 DIAGNOSIS — J449 Chronic obstructive pulmonary disease, unspecified: Secondary | ICD-10-CM | POA: Diagnosis not present

## 2017-09-24 DIAGNOSIS — G919 Hydrocephalus, unspecified: Secondary | ICD-10-CM | POA: Diagnosis not present

## 2017-09-30 DIAGNOSIS — R9431 Abnormal electrocardiogram [ECG] [EKG]: Secondary | ICD-10-CM | POA: Diagnosis not present

## 2017-09-30 DIAGNOSIS — I420 Dilated cardiomyopathy: Secondary | ICD-10-CM | POA: Diagnosis not present

## 2017-09-30 DIAGNOSIS — R000 Tachycardia, unspecified: Secondary | ICD-10-CM | POA: Diagnosis not present

## 2017-09-30 DIAGNOSIS — R0602 Shortness of breath: Secondary | ICD-10-CM | POA: Diagnosis not present

## 2017-09-30 DIAGNOSIS — R0609 Other forms of dyspnea: Secondary | ICD-10-CM | POA: Diagnosis not present

## 2017-09-30 DIAGNOSIS — Z681 Body mass index (BMI) 19 or less, adult: Secondary | ICD-10-CM | POA: Diagnosis not present

## 2017-09-30 DIAGNOSIS — J441 Chronic obstructive pulmonary disease with (acute) exacerbation: Secondary | ICD-10-CM | POA: Diagnosis not present

## 2017-10-12 DIAGNOSIS — Z681 Body mass index (BMI) 19 or less, adult: Secondary | ICD-10-CM | POA: Diagnosis not present

## 2017-10-12 DIAGNOSIS — E782 Mixed hyperlipidemia: Secondary | ICD-10-CM | POA: Diagnosis not present

## 2017-10-12 DIAGNOSIS — D519 Vitamin B12 deficiency anemia, unspecified: Secondary | ICD-10-CM | POA: Diagnosis not present

## 2017-10-12 DIAGNOSIS — C61 Malignant neoplasm of prostate: Secondary | ICD-10-CM | POA: Diagnosis not present

## 2017-10-12 DIAGNOSIS — K219 Gastro-esophageal reflux disease without esophagitis: Secondary | ICD-10-CM | POA: Diagnosis not present

## 2017-10-12 DIAGNOSIS — J439 Emphysema, unspecified: Secondary | ICD-10-CM | POA: Diagnosis not present

## 2017-10-12 DIAGNOSIS — D509 Iron deficiency anemia, unspecified: Secondary | ICD-10-CM | POA: Diagnosis not present

## 2017-10-12 DIAGNOSIS — I2699 Other pulmonary embolism without acute cor pulmonale: Secondary | ICD-10-CM | POA: Diagnosis not present

## 2017-10-20 DIAGNOSIS — D0461 Carcinoma in situ of skin of right upper limb, including shoulder: Secondary | ICD-10-CM | POA: Diagnosis not present

## 2017-10-20 DIAGNOSIS — L570 Actinic keratosis: Secondary | ICD-10-CM | POA: Diagnosis not present

## 2017-11-05 DIAGNOSIS — D519 Vitamin B12 deficiency anemia, unspecified: Secondary | ICD-10-CM | POA: Diagnosis not present

## 2017-11-19 DIAGNOSIS — D519 Vitamin B12 deficiency anemia, unspecified: Secondary | ICD-10-CM | POA: Diagnosis not present

## 2017-12-17 DIAGNOSIS — J441 Chronic obstructive pulmonary disease with (acute) exacerbation: Secondary | ICD-10-CM | POA: Diagnosis not present

## 2017-12-17 DIAGNOSIS — J439 Emphysema, unspecified: Secondary | ICD-10-CM | POA: Diagnosis not present

## 2017-12-17 DIAGNOSIS — D509 Iron deficiency anemia, unspecified: Secondary | ICD-10-CM | POA: Diagnosis not present

## 2017-12-17 DIAGNOSIS — D519 Vitamin B12 deficiency anemia, unspecified: Secondary | ICD-10-CM | POA: Diagnosis not present

## 2017-12-17 DIAGNOSIS — K219 Gastro-esophageal reflux disease without esophagitis: Secondary | ICD-10-CM | POA: Diagnosis not present

## 2017-12-17 DIAGNOSIS — C61 Malignant neoplasm of prostate: Secondary | ICD-10-CM | POA: Diagnosis not present

## 2017-12-17 DIAGNOSIS — E559 Vitamin D deficiency, unspecified: Secondary | ICD-10-CM | POA: Diagnosis not present

## 2017-12-17 DIAGNOSIS — E782 Mixed hyperlipidemia: Secondary | ICD-10-CM | POA: Diagnosis not present

## 2017-12-17 DIAGNOSIS — I2699 Other pulmonary embolism without acute cor pulmonale: Secondary | ICD-10-CM | POA: Diagnosis not present

## 2017-12-27 DIAGNOSIS — I700 Atherosclerosis of aorta: Secondary | ICD-10-CM | POA: Diagnosis not present

## 2017-12-27 DIAGNOSIS — D509 Iron deficiency anemia, unspecified: Secondary | ICD-10-CM | POA: Diagnosis not present

## 2017-12-27 DIAGNOSIS — Z0001 Encounter for general adult medical examination with abnormal findings: Secondary | ICD-10-CM | POA: Diagnosis not present

## 2017-12-27 DIAGNOSIS — R911 Solitary pulmonary nodule: Secondary | ICD-10-CM | POA: Diagnosis not present

## 2017-12-27 DIAGNOSIS — K219 Gastro-esophageal reflux disease without esophagitis: Secondary | ICD-10-CM | POA: Diagnosis not present

## 2017-12-27 DIAGNOSIS — Z681 Body mass index (BMI) 19 or less, adult: Secondary | ICD-10-CM | POA: Diagnosis not present

## 2017-12-27 DIAGNOSIS — E782 Mixed hyperlipidemia: Secondary | ICD-10-CM | POA: Diagnosis not present

## 2017-12-27 DIAGNOSIS — J439 Emphysema, unspecified: Secondary | ICD-10-CM | POA: Diagnosis not present

## 2017-12-27 DIAGNOSIS — C61 Malignant neoplasm of prostate: Secondary | ICD-10-CM | POA: Diagnosis not present

## 2017-12-27 DIAGNOSIS — R918 Other nonspecific abnormal finding of lung field: Secondary | ICD-10-CM | POA: Diagnosis not present

## 2017-12-27 DIAGNOSIS — I2699 Other pulmonary embolism without acute cor pulmonale: Secondary | ICD-10-CM | POA: Diagnosis not present

## 2018-03-07 DIAGNOSIS — L821 Other seborrheic keratosis: Secondary | ICD-10-CM | POA: Diagnosis not present

## 2018-03-07 DIAGNOSIS — L570 Actinic keratosis: Secondary | ICD-10-CM | POA: Diagnosis not present

## 2018-03-07 DIAGNOSIS — D229 Melanocytic nevi, unspecified: Secondary | ICD-10-CM | POA: Diagnosis not present

## 2018-03-25 DIAGNOSIS — D509 Iron deficiency anemia, unspecified: Secondary | ICD-10-CM | POA: Diagnosis not present

## 2018-03-25 DIAGNOSIS — R634 Abnormal weight loss: Secondary | ICD-10-CM | POA: Diagnosis not present

## 2018-03-25 DIAGNOSIS — E559 Vitamin D deficiency, unspecified: Secondary | ICD-10-CM | POA: Diagnosis not present

## 2018-03-25 DIAGNOSIS — F17211 Nicotine dependence, cigarettes, in remission: Secondary | ICD-10-CM | POA: Diagnosis not present

## 2018-03-25 DIAGNOSIS — J441 Chronic obstructive pulmonary disease with (acute) exacerbation: Secondary | ICD-10-CM | POA: Diagnosis not present

## 2018-03-25 DIAGNOSIS — K219 Gastro-esophageal reflux disease without esophagitis: Secondary | ICD-10-CM | POA: Diagnosis not present

## 2018-03-25 DIAGNOSIS — E782 Mixed hyperlipidemia: Secondary | ICD-10-CM | POA: Diagnosis not present

## 2018-03-25 DIAGNOSIS — D519 Vitamin B12 deficiency anemia, unspecified: Secondary | ICD-10-CM | POA: Diagnosis not present

## 2018-04-08 DIAGNOSIS — I700 Atherosclerosis of aorta: Secondary | ICD-10-CM | POA: Diagnosis not present

## 2018-04-08 DIAGNOSIS — Z23 Encounter for immunization: Secondary | ICD-10-CM | POA: Diagnosis not present

## 2018-04-08 DIAGNOSIS — R634 Abnormal weight loss: Secondary | ICD-10-CM | POA: Diagnosis not present

## 2018-04-08 DIAGNOSIS — D509 Iron deficiency anemia, unspecified: Secondary | ICD-10-CM | POA: Diagnosis not present

## 2018-04-08 DIAGNOSIS — J439 Emphysema, unspecified: Secondary | ICD-10-CM | POA: Diagnosis not present

## 2018-04-08 DIAGNOSIS — R918 Other nonspecific abnormal finding of lung field: Secondary | ICD-10-CM | POA: Diagnosis not present

## 2018-04-08 DIAGNOSIS — Z8546 Personal history of malignant neoplasm of prostate: Secondary | ICD-10-CM | POA: Diagnosis not present

## 2018-04-08 DIAGNOSIS — I5020 Unspecified systolic (congestive) heart failure: Secondary | ICD-10-CM | POA: Diagnosis not present

## 2018-04-12 DIAGNOSIS — Z79899 Other long term (current) drug therapy: Secondary | ICD-10-CM | POA: Diagnosis not present

## 2018-04-12 DIAGNOSIS — C61 Malignant neoplasm of prostate: Secondary | ICD-10-CM | POA: Diagnosis not present

## 2018-06-03 DIAGNOSIS — J449 Chronic obstructive pulmonary disease, unspecified: Secondary | ICD-10-CM | POA: Diagnosis not present

## 2018-06-03 DIAGNOSIS — R0602 Shortness of breath: Secondary | ICD-10-CM | POA: Diagnosis not present

## 2018-06-03 DIAGNOSIS — J9610 Chronic respiratory failure, unspecified whether with hypoxia or hypercapnia: Secondary | ICD-10-CM | POA: Diagnosis not present

## 2018-07-29 DIAGNOSIS — F17211 Nicotine dependence, cigarettes, in remission: Secondary | ICD-10-CM | POA: Diagnosis not present

## 2018-07-29 DIAGNOSIS — J439 Emphysema, unspecified: Secondary | ICD-10-CM | POA: Diagnosis not present

## 2018-07-29 DIAGNOSIS — E782 Mixed hyperlipidemia: Secondary | ICD-10-CM | POA: Diagnosis not present

## 2018-07-29 DIAGNOSIS — J441 Chronic obstructive pulmonary disease with (acute) exacerbation: Secondary | ICD-10-CM | POA: Diagnosis not present

## 2018-07-29 DIAGNOSIS — D519 Vitamin B12 deficiency anemia, unspecified: Secondary | ICD-10-CM | POA: Diagnosis not present

## 2018-07-29 DIAGNOSIS — E559 Vitamin D deficiency, unspecified: Secondary | ICD-10-CM | POA: Diagnosis not present

## 2018-07-29 DIAGNOSIS — I700 Atherosclerosis of aorta: Secondary | ICD-10-CM | POA: Diagnosis not present

## 2018-07-29 DIAGNOSIS — R634 Abnormal weight loss: Secondary | ICD-10-CM | POA: Diagnosis not present

## 2018-07-29 DIAGNOSIS — R918 Other nonspecific abnormal finding of lung field: Secondary | ICD-10-CM | POA: Diagnosis not present

## 2018-07-29 DIAGNOSIS — D509 Iron deficiency anemia, unspecified: Secondary | ICD-10-CM | POA: Diagnosis not present

## 2018-07-29 DIAGNOSIS — J479 Bronchiectasis, uncomplicated: Secondary | ICD-10-CM | POA: Diagnosis not present

## 2018-07-29 DIAGNOSIS — R5382 Chronic fatigue, unspecified: Secondary | ICD-10-CM | POA: Diagnosis not present

## 2018-07-29 DIAGNOSIS — K219 Gastro-esophageal reflux disease without esophagitis: Secondary | ICD-10-CM | POA: Diagnosis not present

## 2018-09-09 DIAGNOSIS — Z8546 Personal history of malignant neoplasm of prostate: Secondary | ICD-10-CM | POA: Diagnosis not present

## 2018-09-09 DIAGNOSIS — I5020 Unspecified systolic (congestive) heart failure: Secondary | ICD-10-CM | POA: Diagnosis not present

## 2018-09-09 DIAGNOSIS — Z6821 Body mass index (BMI) 21.0-21.9, adult: Secondary | ICD-10-CM | POA: Diagnosis not present

## 2018-09-09 DIAGNOSIS — J479 Bronchiectasis, uncomplicated: Secondary | ICD-10-CM | POA: Diagnosis not present

## 2018-09-09 DIAGNOSIS — D509 Iron deficiency anemia, unspecified: Secondary | ICD-10-CM | POA: Diagnosis not present

## 2018-09-09 DIAGNOSIS — J439 Emphysema, unspecified: Secondary | ICD-10-CM | POA: Diagnosis not present

## 2018-09-09 DIAGNOSIS — R918 Other nonspecific abnormal finding of lung field: Secondary | ICD-10-CM | POA: Diagnosis not present

## 2018-09-09 DIAGNOSIS — E782 Mixed hyperlipidemia: Secondary | ICD-10-CM | POA: Diagnosis not present

## 2018-12-16 DIAGNOSIS — R351 Nocturia: Secondary | ICD-10-CM | POA: Diagnosis not present

## 2018-12-16 DIAGNOSIS — Z8546 Personal history of malignant neoplasm of prostate: Secondary | ICD-10-CM | POA: Diagnosis not present

## 2018-12-16 DIAGNOSIS — Z87440 Personal history of urinary (tract) infections: Secondary | ICD-10-CM | POA: Diagnosis not present

## 2018-12-16 DIAGNOSIS — J449 Chronic obstructive pulmonary disease, unspecified: Secondary | ICD-10-CM | POA: Diagnosis not present

## 2018-12-16 DIAGNOSIS — R3914 Feeling of incomplete bladder emptying: Secondary | ICD-10-CM | POA: Diagnosis not present

## 2018-12-16 DIAGNOSIS — N401 Enlarged prostate with lower urinary tract symptoms: Secondary | ICD-10-CM | POA: Diagnosis not present

## 2018-12-16 DIAGNOSIS — R3129 Other microscopic hematuria: Secondary | ICD-10-CM | POA: Diagnosis not present

## 2018-12-27 DIAGNOSIS — J441 Chronic obstructive pulmonary disease with (acute) exacerbation: Secondary | ICD-10-CM | POA: Diagnosis not present

## 2018-12-27 DIAGNOSIS — R5382 Chronic fatigue, unspecified: Secondary | ICD-10-CM | POA: Diagnosis not present

## 2018-12-27 DIAGNOSIS — D649 Anemia, unspecified: Secondary | ICD-10-CM | POA: Diagnosis not present

## 2018-12-27 DIAGNOSIS — K219 Gastro-esophageal reflux disease without esophagitis: Secondary | ICD-10-CM | POA: Diagnosis not present

## 2018-12-27 DIAGNOSIS — E782 Mixed hyperlipidemia: Secondary | ICD-10-CM | POA: Diagnosis not present

## 2018-12-27 DIAGNOSIS — D529 Folate deficiency anemia, unspecified: Secondary | ICD-10-CM | POA: Diagnosis not present

## 2018-12-27 DIAGNOSIS — D519 Vitamin B12 deficiency anemia, unspecified: Secondary | ICD-10-CM | POA: Diagnosis not present

## 2018-12-30 DIAGNOSIS — I2510 Atherosclerotic heart disease of native coronary artery without angina pectoris: Secondary | ICD-10-CM | POA: Diagnosis not present

## 2018-12-30 DIAGNOSIS — J9611 Chronic respiratory failure with hypoxia: Secondary | ICD-10-CM | POA: Diagnosis not present

## 2018-12-30 DIAGNOSIS — Z8546 Personal history of malignant neoplasm of prostate: Secondary | ICD-10-CM | POA: Diagnosis not present

## 2018-12-30 DIAGNOSIS — J479 Bronchiectasis, uncomplicated: Secondary | ICD-10-CM | POA: Diagnosis not present

## 2018-12-30 DIAGNOSIS — I5020 Unspecified systolic (congestive) heart failure: Secondary | ICD-10-CM | POA: Diagnosis not present

## 2018-12-30 DIAGNOSIS — J439 Emphysema, unspecified: Secondary | ICD-10-CM | POA: Diagnosis not present

## 2018-12-30 DIAGNOSIS — R918 Other nonspecific abnormal finding of lung field: Secondary | ICD-10-CM | POA: Diagnosis not present

## 2018-12-30 DIAGNOSIS — Z0001 Encounter for general adult medical examination with abnormal findings: Secondary | ICD-10-CM | POA: Diagnosis not present

## 2018-12-30 DIAGNOSIS — Z6823 Body mass index (BMI) 23.0-23.9, adult: Secondary | ICD-10-CM | POA: Diagnosis not present

## 2018-12-30 DIAGNOSIS — J9610 Chronic respiratory failure, unspecified whether with hypoxia or hypercapnia: Secondary | ICD-10-CM | POA: Diagnosis not present

## 2018-12-30 DIAGNOSIS — I700 Atherosclerosis of aorta: Secondary | ICD-10-CM | POA: Diagnosis not present

## 2019-02-09 DIAGNOSIS — Z23 Encounter for immunization: Secondary | ICD-10-CM | POA: Diagnosis not present

## 2019-03-08 DIAGNOSIS — D485 Neoplasm of uncertain behavior of skin: Secondary | ICD-10-CM | POA: Diagnosis not present

## 2019-03-08 DIAGNOSIS — C44319 Basal cell carcinoma of skin of other parts of face: Secondary | ICD-10-CM | POA: Diagnosis not present

## 2019-03-08 DIAGNOSIS — D229 Melanocytic nevi, unspecified: Secondary | ICD-10-CM | POA: Diagnosis not present

## 2019-03-08 DIAGNOSIS — L570 Actinic keratosis: Secondary | ICD-10-CM | POA: Diagnosis not present

## 2019-03-08 DIAGNOSIS — D0461 Carcinoma in situ of skin of right upper limb, including shoulder: Secondary | ICD-10-CM | POA: Diagnosis not present

## 2019-03-08 DIAGNOSIS — L814 Other melanin hyperpigmentation: Secondary | ICD-10-CM | POA: Diagnosis not present

## 2019-03-24 DIAGNOSIS — J441 Chronic obstructive pulmonary disease with (acute) exacerbation: Secondary | ICD-10-CM | POA: Diagnosis not present

## 2019-03-24 DIAGNOSIS — E782 Mixed hyperlipidemia: Secondary | ICD-10-CM | POA: Diagnosis not present

## 2019-03-26 IMAGING — DX DG CHEST 1V PORT
1 series · 1 of 1 positions shown · non-contrast
Comparison: None.

CLINICAL DATA: 71-year-old male with shortness of breath.

EXAM:
PORTABLE CHEST 1 VIEW

[chest]
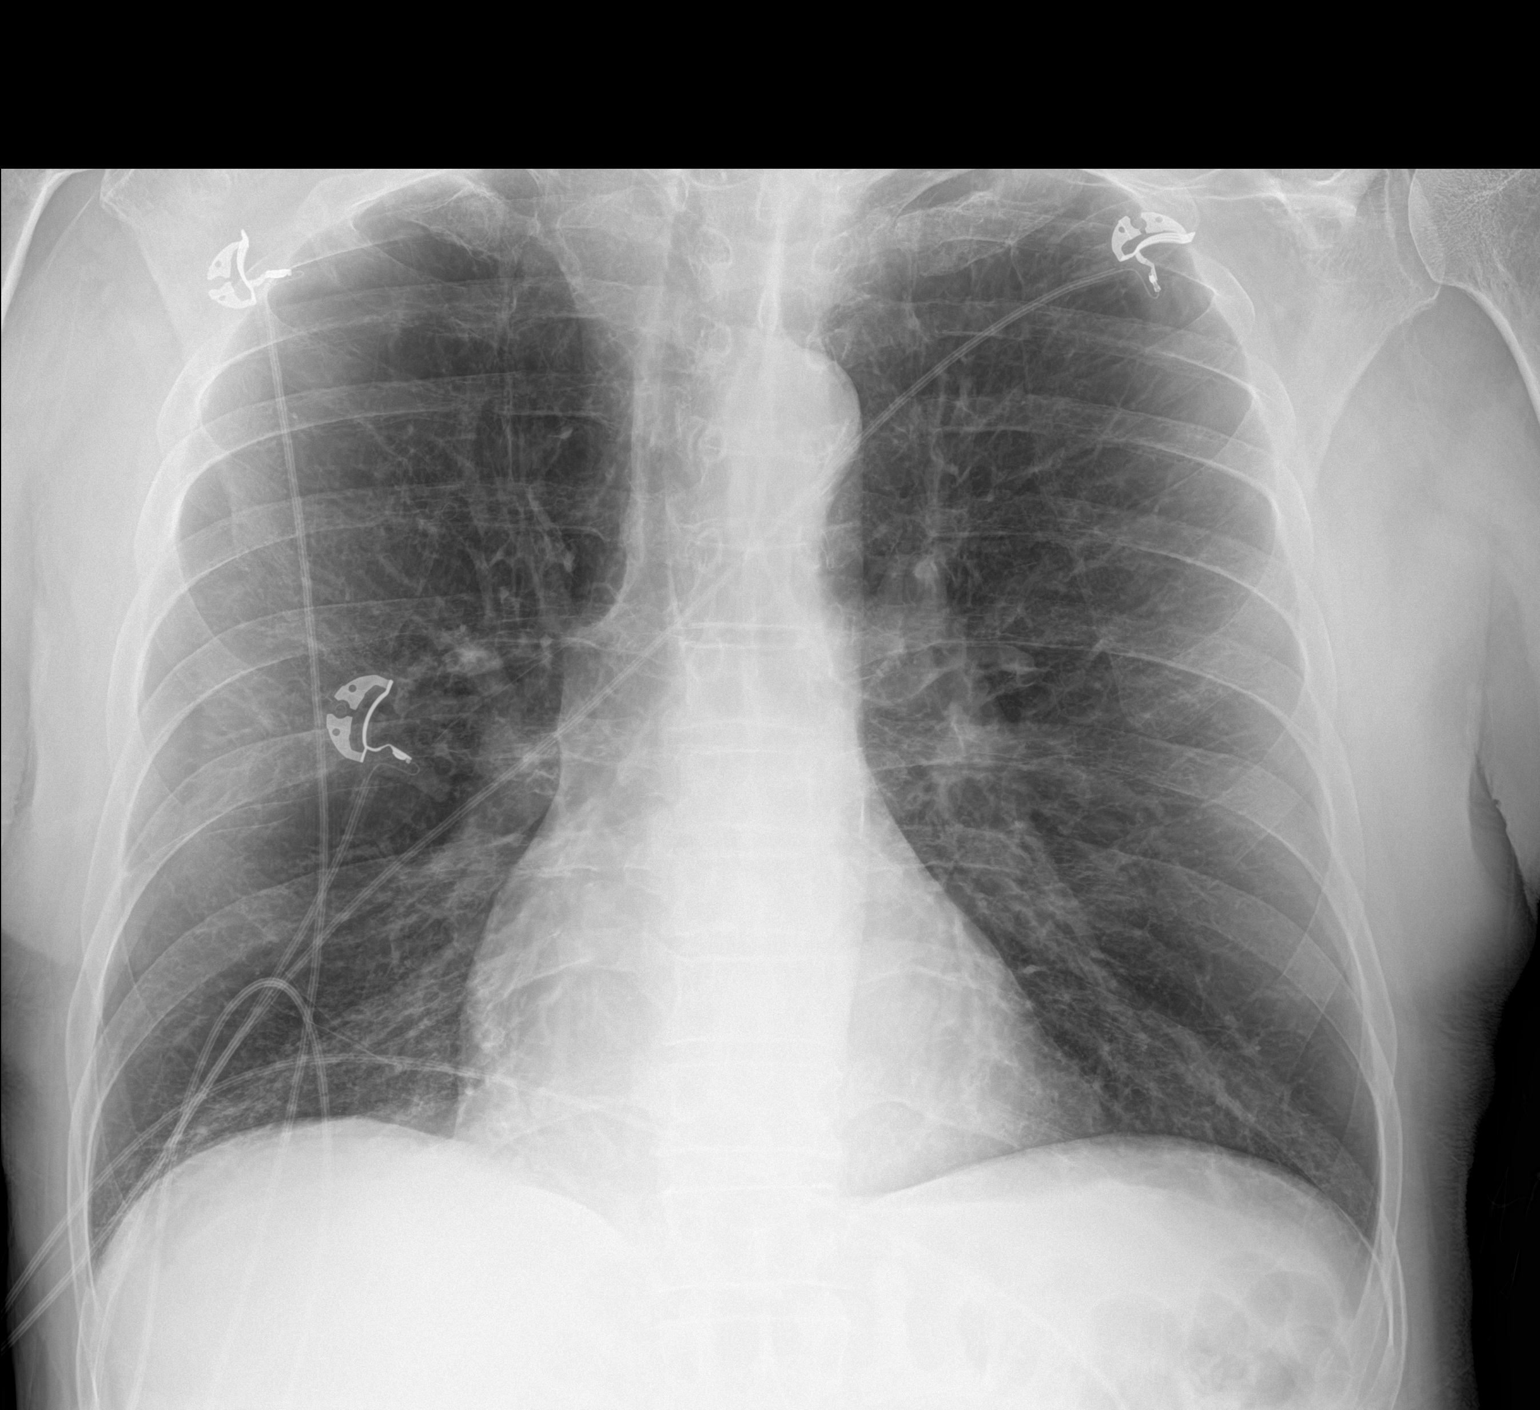

[1 of 1 positions shown; findings below may reference images not displayed]

FINDINGS: Probable mild emphysematous changes. No focal consolidation, pleural
effusion, or pneumothorax. The cardiac silhouette is within normal
limits. No acute osseous pathology.
IMPRESSION: No active disease.

## 2019-03-26 IMAGING — CT CT ANGIO CHEST
2 of 8 series · 18 of 36 positions shown · IV contrast (OMNI)
Comparison: Chest radiograph dated 07/20/2017

CLINICAL DATA: Shortness of breath x6 days, evaluate for PE

EXAM:
CT ANGIOGRAPHY CHEST WITH CONTRAST
TECHNIQUE: Multidetector CT imaging of the chest was performed using the
standard protocol during bolus administration of intravenous
contrast. Multiplanar CT image reconstructions and MIPs were
obtained to evaluate the vascular anatomy.
CONTRAST:  100mL Z0203A-VDU IOPAMIDOL (Z0203A-VDU) INJECTION 76%

[Series 6: thins · axial · 0.70mm/px · z∈[+1236,+1535]mm · 17 of 335 slices shown]
[im 18/335  lung]
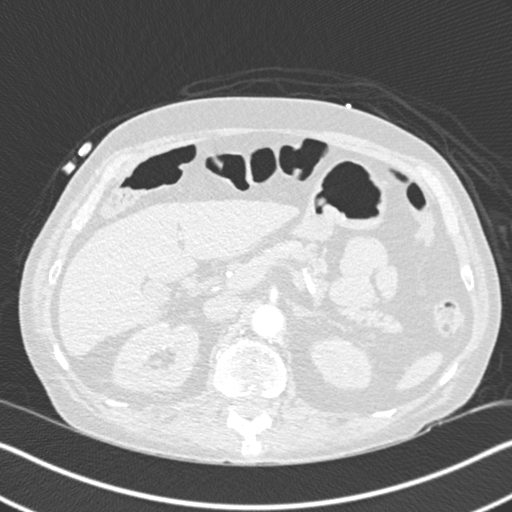
[im 36/335  mediastinal]
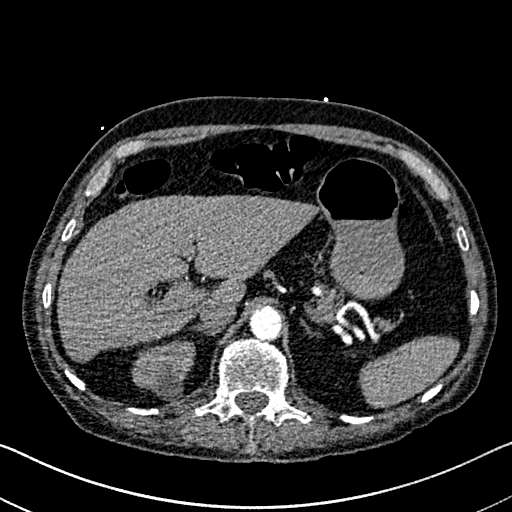
[im 53/335  lung]
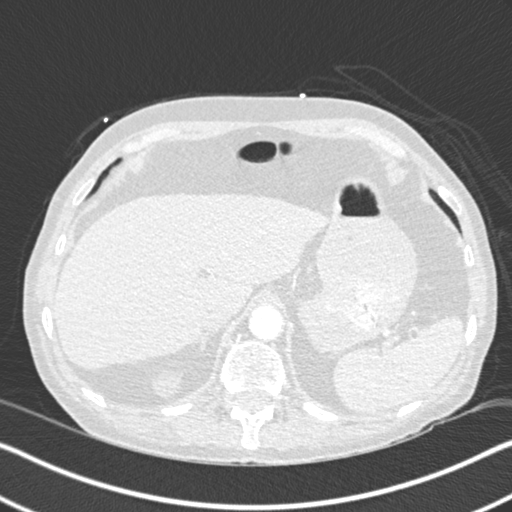
[im 71/335  mediastinal]
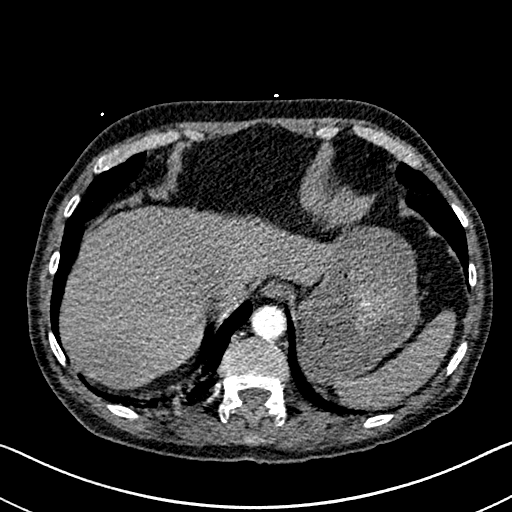
[im 88/335  lung]
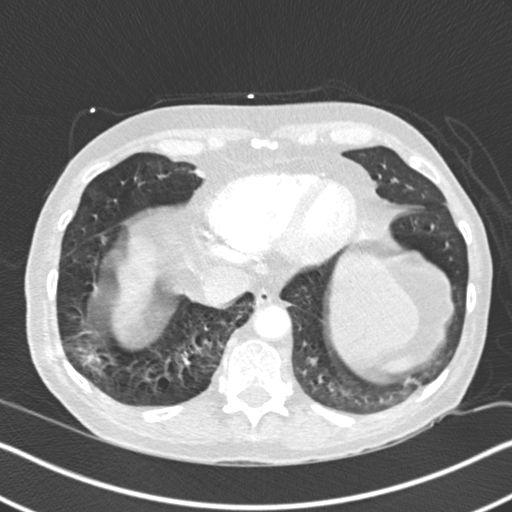
[im 106/335  mediastinal]
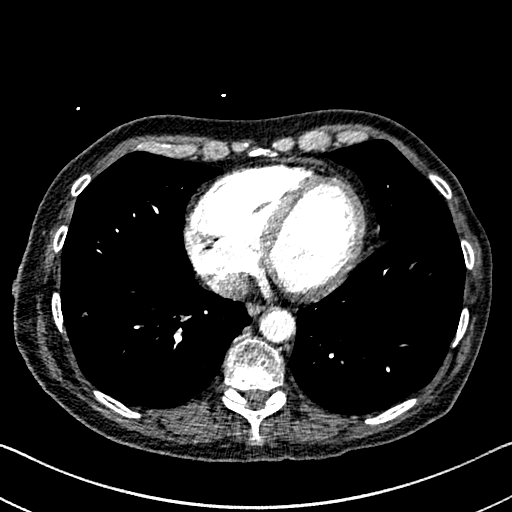
[im 124/335  lung]
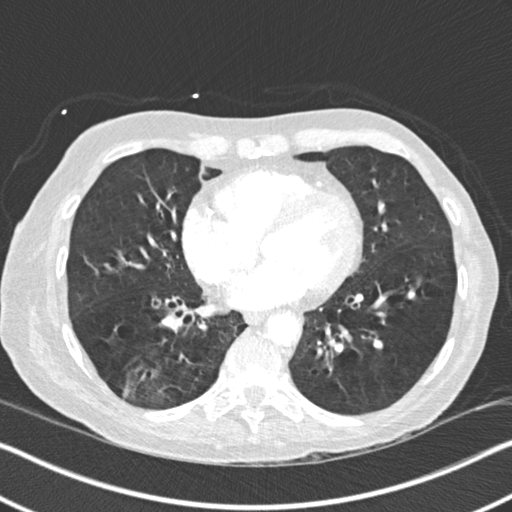
[im 141/335  mediastinal]
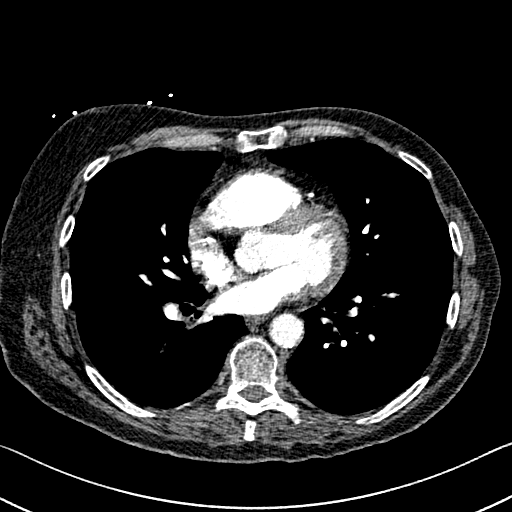
[im 176/335  lung]
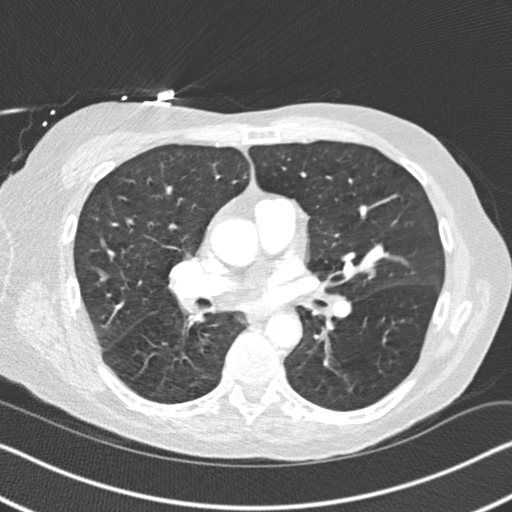
[im 194/335  mediastinal]
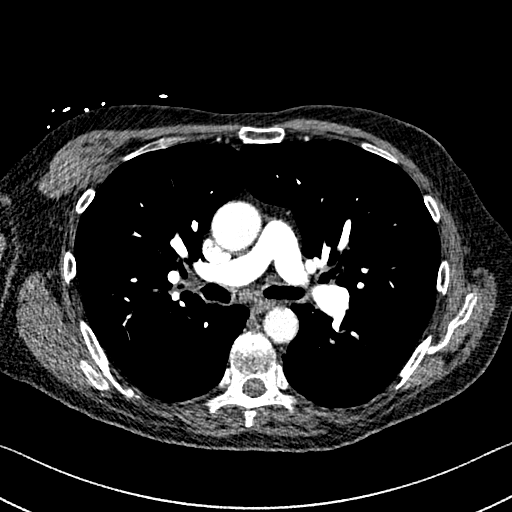
[im 211/335  lung]
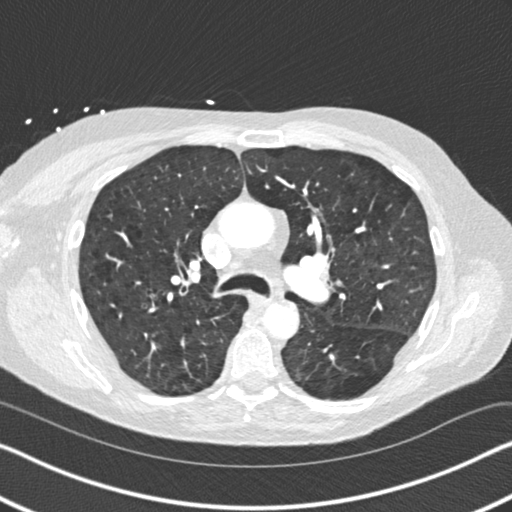
[im 229/335  mediastinal]
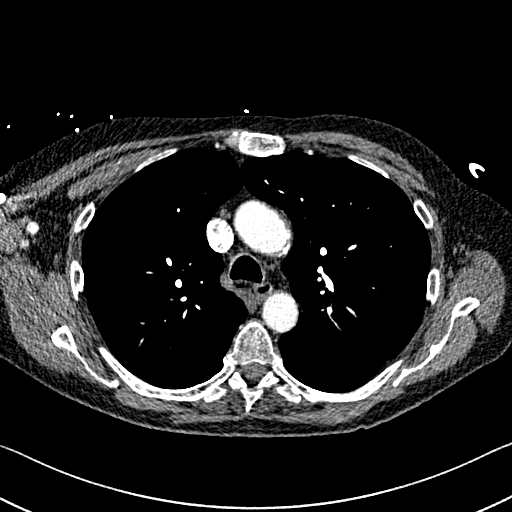
[im 247/335  lung]
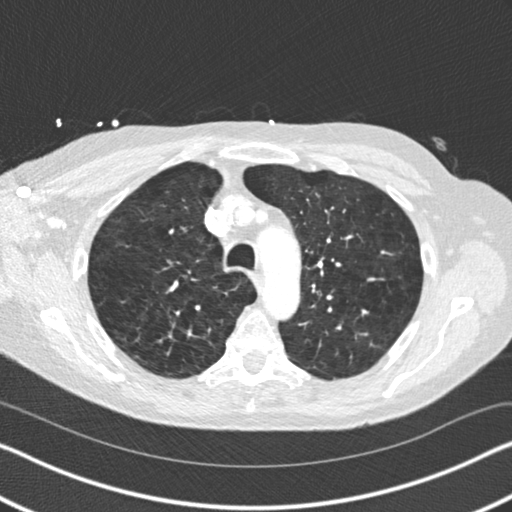
[im 264/335  mediastinal]
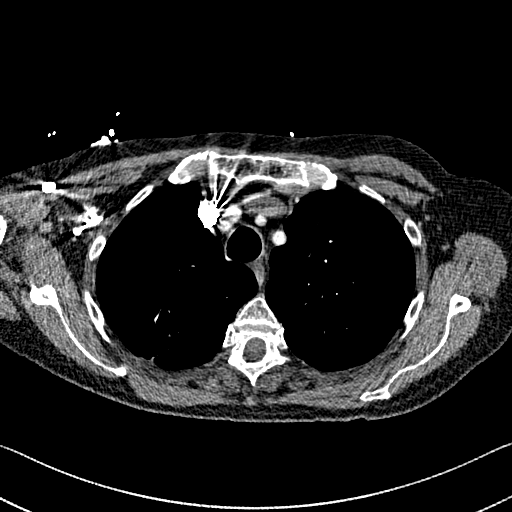
[im 282/335  lung]
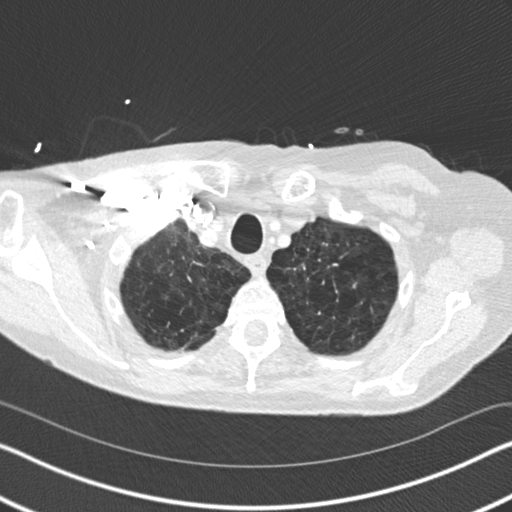
[im 299/335  mediastinal]
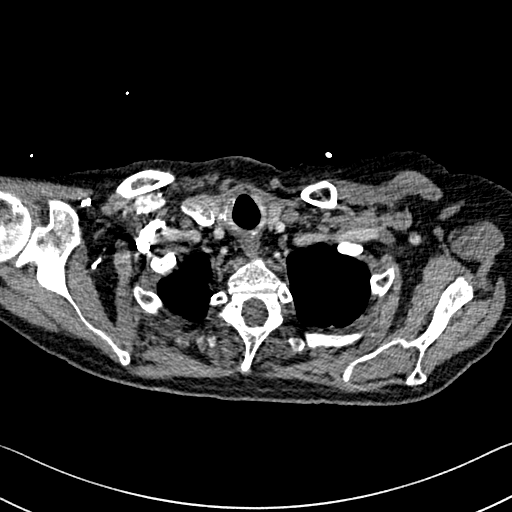
[im 317/335  lung]
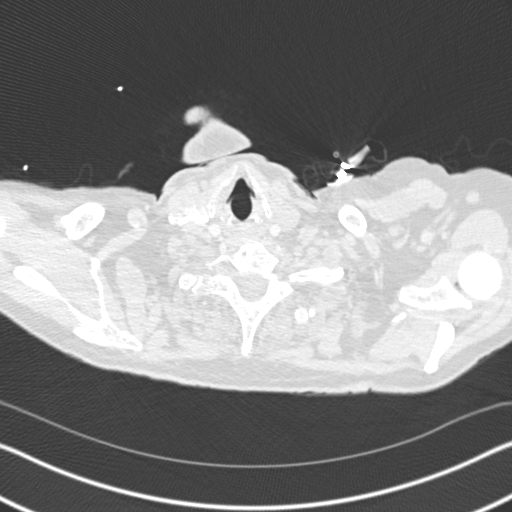

[Series 8: coronal mpr · coronal · 0.65mm/px · 1 of 126 slices shown]
[im 63/126  mediastinal]
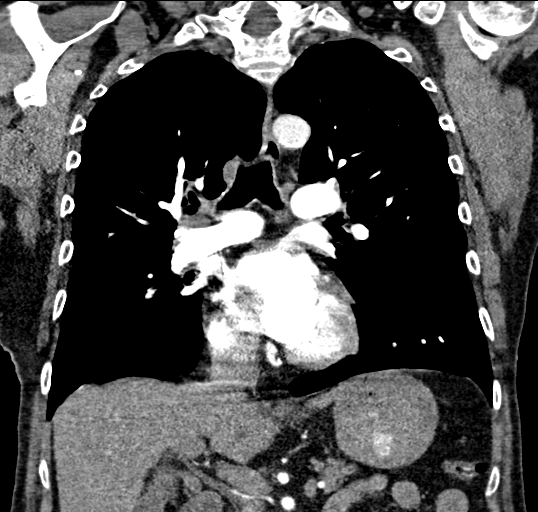

[18 of 36 positions shown; findings below may reference images not displayed]

FINDINGS: Cardiovascular: Satisfactory opacification of the bilateral
pulmonary artery to the segmental level. Evaluation the bilateral
lower lobe pulmonary arteries is mildly constrained by respiratory
motion. No evidence of pulmonary embolism.

No evidence of thoracic aortic aneurysm or dissection.
Atherosclerotic calcifications of the aortic arch.

The heart is normal in size.  No pericardial effusion.

Three vessel coronary atherosclerosis.

Mediastinum/Nodes: No suspicious mediastinal lymphadenopathy.

Visualized thyroid is unremarkable.

Lungs/Pleura: Evaluation of the lung parenchyma is constrained by
respiratory motion.

Mild subpleural nodular scarring in the medial left lower lobe
(series 7/image 46). 6 mm right lower lobe nodule (series 7/image
77).

Moderate centrilobular and paraseptal emphysematous changes, upper
lobe predominant.

Mild right lower lobe scarring/atelectasis.

No focal consolidation.

No pleural effusion or pneumothorax.

Upper Abdomen: Visualized upper abdomen is notable for a 2.7 cm
right upper pole renal cyst.

Musculoskeletal: Visualized osseous structures are within normal
limits.

Review of the MIP images confirms the above findings.
IMPRESSION: No evidence of pulmonary embolism.

6 mm right lower lobe pulmonary nodule. Non-contrast chest CT at
6-12 months is recommended. If the nodule is stable at time of
repeat CT, then future CT at 18-24 months (from today's scan) is
recommended for this high-risk patient. This recommendation follows
the consensus statement: Guidelines for Management of Incidental
Pulmonary Nodules Detected on CT Images: From the [HOSPITAL]

Aortic Atherosclerosis (KKDE3-Y98.8) and Emphysema (KKDE3-1ID.1).

## 2019-03-27 DIAGNOSIS — C44319 Basal cell carcinoma of skin of other parts of face: Secondary | ICD-10-CM | POA: Diagnosis not present

## 2019-04-14 DIAGNOSIS — J189 Pneumonia, unspecified organism: Secondary | ICD-10-CM | POA: Diagnosis not present

## 2019-04-14 DIAGNOSIS — U071 COVID-19: Secondary | ICD-10-CM | POA: Diagnosis not present

## 2019-05-25 DIAGNOSIS — K219 Gastro-esophageal reflux disease without esophagitis: Secondary | ICD-10-CM | POA: Diagnosis not present

## 2019-05-25 DIAGNOSIS — J441 Chronic obstructive pulmonary disease with (acute) exacerbation: Secondary | ICD-10-CM | POA: Diagnosis not present

## 2019-06-07 DIAGNOSIS — J449 Chronic obstructive pulmonary disease, unspecified: Secondary | ICD-10-CM | POA: Diagnosis not present

## 2019-06-07 DIAGNOSIS — J9610 Chronic respiratory failure, unspecified whether with hypoxia or hypercapnia: Secondary | ICD-10-CM | POA: Diagnosis not present

## 2019-06-07 DIAGNOSIS — J301 Allergic rhinitis due to pollen: Secondary | ICD-10-CM | POA: Diagnosis not present

## 2019-06-23 DIAGNOSIS — E7849 Other hyperlipidemia: Secondary | ICD-10-CM | POA: Diagnosis not present

## 2019-06-23 DIAGNOSIS — K219 Gastro-esophageal reflux disease without esophagitis: Secondary | ICD-10-CM | POA: Diagnosis not present

## 2019-06-27 DIAGNOSIS — K219 Gastro-esophageal reflux disease without esophagitis: Secondary | ICD-10-CM | POA: Diagnosis not present

## 2019-06-27 DIAGNOSIS — E559 Vitamin D deficiency, unspecified: Secondary | ICD-10-CM | POA: Diagnosis not present

## 2019-06-27 DIAGNOSIS — D649 Anemia, unspecified: Secondary | ICD-10-CM | POA: Diagnosis not present

## 2019-06-27 DIAGNOSIS — E782 Mixed hyperlipidemia: Secondary | ICD-10-CM | POA: Diagnosis not present

## 2019-06-27 DIAGNOSIS — R5382 Chronic fatigue, unspecified: Secondary | ICD-10-CM | POA: Diagnosis not present

## 2019-06-27 DIAGNOSIS — I10 Essential (primary) hypertension: Secondary | ICD-10-CM | POA: Diagnosis not present

## 2019-06-30 DIAGNOSIS — J439 Emphysema, unspecified: Secondary | ICD-10-CM | POA: Diagnosis not present

## 2019-06-30 DIAGNOSIS — Z6823 Body mass index (BMI) 23.0-23.9, adult: Secondary | ICD-10-CM | POA: Diagnosis not present

## 2019-06-30 DIAGNOSIS — I700 Atherosclerosis of aorta: Secondary | ICD-10-CM | POA: Diagnosis not present

## 2019-06-30 DIAGNOSIS — D509 Iron deficiency anemia, unspecified: Secondary | ICD-10-CM | POA: Diagnosis not present

## 2019-06-30 DIAGNOSIS — I5020 Unspecified systolic (congestive) heart failure: Secondary | ICD-10-CM | POA: Diagnosis not present

## 2019-06-30 DIAGNOSIS — J9611 Chronic respiratory failure with hypoxia: Secondary | ICD-10-CM | POA: Diagnosis not present

## 2019-06-30 DIAGNOSIS — R918 Other nonspecific abnormal finding of lung field: Secondary | ICD-10-CM | POA: Diagnosis not present

## 2019-06-30 DIAGNOSIS — J479 Bronchiectasis, uncomplicated: Secondary | ICD-10-CM | POA: Diagnosis not present

## 2019-07-24 DIAGNOSIS — Z23 Encounter for immunization: Secondary | ICD-10-CM | POA: Diagnosis not present

## 2019-07-28 DIAGNOSIS — J439 Emphysema, unspecified: Secondary | ICD-10-CM | POA: Diagnosis not present

## 2019-07-28 DIAGNOSIS — R918 Other nonspecific abnormal finding of lung field: Secondary | ICD-10-CM | POA: Diagnosis not present

## 2019-07-28 DIAGNOSIS — J479 Bronchiectasis, uncomplicated: Secondary | ICD-10-CM | POA: Diagnosis not present

## 2019-07-28 DIAGNOSIS — I5020 Unspecified systolic (congestive) heart failure: Secondary | ICD-10-CM | POA: Diagnosis not present

## 2019-07-28 DIAGNOSIS — I5022 Chronic systolic (congestive) heart failure: Secondary | ICD-10-CM | POA: Diagnosis not present

## 2019-07-28 DIAGNOSIS — R911 Solitary pulmonary nodule: Secondary | ICD-10-CM | POA: Diagnosis not present

## 2019-07-28 DIAGNOSIS — I700 Atherosclerosis of aorta: Secondary | ICD-10-CM | POA: Diagnosis not present

## 2019-07-28 DIAGNOSIS — I340 Nonrheumatic mitral (valve) insufficiency: Secondary | ICD-10-CM | POA: Diagnosis not present

## 2019-08-21 DIAGNOSIS — Z23 Encounter for immunization: Secondary | ICD-10-CM | POA: Diagnosis not present

## 2019-08-23 DIAGNOSIS — I700 Atherosclerosis of aorta: Secondary | ICD-10-CM | POA: Diagnosis not present

## 2019-08-23 DIAGNOSIS — E7849 Other hyperlipidemia: Secondary | ICD-10-CM | POA: Diagnosis not present

## 2019-09-22 DIAGNOSIS — J439 Emphysema, unspecified: Secondary | ICD-10-CM | POA: Diagnosis not present

## 2019-09-22 DIAGNOSIS — D509 Iron deficiency anemia, unspecified: Secondary | ICD-10-CM | POA: Diagnosis not present

## 2019-09-22 DIAGNOSIS — E7849 Other hyperlipidemia: Secondary | ICD-10-CM | POA: Diagnosis not present

## 2019-11-30 DIAGNOSIS — J449 Chronic obstructive pulmonary disease, unspecified: Secondary | ICD-10-CM | POA: Diagnosis not present

## 2019-12-18 DIAGNOSIS — N401 Enlarged prostate with lower urinary tract symptoms: Secondary | ICD-10-CM | POA: Diagnosis not present

## 2019-12-18 DIAGNOSIS — R3914 Feeling of incomplete bladder emptying: Secondary | ICD-10-CM | POA: Diagnosis not present

## 2019-12-18 DIAGNOSIS — R3129 Other microscopic hematuria: Secondary | ICD-10-CM | POA: Diagnosis not present

## 2019-12-18 DIAGNOSIS — R350 Frequency of micturition: Secondary | ICD-10-CM | POA: Diagnosis not present

## 2019-12-18 DIAGNOSIS — R3912 Poor urinary stream: Secondary | ICD-10-CM | POA: Diagnosis not present

## 2019-12-18 DIAGNOSIS — C61 Malignant neoplasm of prostate: Secondary | ICD-10-CM | POA: Diagnosis not present

## 2019-12-22 DIAGNOSIS — I5021 Acute systolic (congestive) heart failure: Secondary | ICD-10-CM | POA: Diagnosis not present

## 2019-12-22 DIAGNOSIS — J9611 Chronic respiratory failure with hypoxia: Secondary | ICD-10-CM | POA: Diagnosis not present

## 2019-12-22 DIAGNOSIS — J441 Chronic obstructive pulmonary disease with (acute) exacerbation: Secondary | ICD-10-CM | POA: Diagnosis not present

## 2019-12-22 DIAGNOSIS — I110 Hypertensive heart disease with heart failure: Secondary | ICD-10-CM | POA: Diagnosis not present

## 2019-12-22 DIAGNOSIS — Z87891 Personal history of nicotine dependence: Secondary | ICD-10-CM | POA: Diagnosis not present

## 2019-12-26 DIAGNOSIS — D509 Iron deficiency anemia, unspecified: Secondary | ICD-10-CM | POA: Diagnosis not present

## 2019-12-26 DIAGNOSIS — D519 Vitamin B12 deficiency anemia, unspecified: Secondary | ICD-10-CM | POA: Diagnosis not present

## 2019-12-26 DIAGNOSIS — I10 Essential (primary) hypertension: Secondary | ICD-10-CM | POA: Diagnosis not present

## 2019-12-26 DIAGNOSIS — F17211 Nicotine dependence, cigarettes, in remission: Secondary | ICD-10-CM | POA: Diagnosis not present

## 2019-12-26 DIAGNOSIS — E782 Mixed hyperlipidemia: Secondary | ICD-10-CM | POA: Diagnosis not present

## 2019-12-26 DIAGNOSIS — J441 Chronic obstructive pulmonary disease with (acute) exacerbation: Secondary | ICD-10-CM | POA: Diagnosis not present

## 2019-12-26 DIAGNOSIS — C61 Malignant neoplasm of prostate: Secondary | ICD-10-CM | POA: Diagnosis not present

## 2019-12-26 DIAGNOSIS — R5382 Chronic fatigue, unspecified: Secondary | ICD-10-CM | POA: Diagnosis not present

## 2019-12-29 DIAGNOSIS — Z0001 Encounter for general adult medical examination with abnormal findings: Secondary | ICD-10-CM | POA: Diagnosis not present

## 2019-12-29 DIAGNOSIS — J439 Emphysema, unspecified: Secondary | ICD-10-CM | POA: Diagnosis not present

## 2019-12-29 DIAGNOSIS — I5020 Unspecified systolic (congestive) heart failure: Secondary | ICD-10-CM | POA: Diagnosis not present

## 2019-12-29 DIAGNOSIS — E782 Mixed hyperlipidemia: Secondary | ICD-10-CM | POA: Diagnosis not present

## 2019-12-29 DIAGNOSIS — I700 Atherosclerosis of aorta: Secondary | ICD-10-CM | POA: Diagnosis not present

## 2019-12-29 DIAGNOSIS — J479 Bronchiectasis, uncomplicated: Secondary | ICD-10-CM | POA: Diagnosis not present

## 2019-12-29 DIAGNOSIS — R918 Other nonspecific abnormal finding of lung field: Secondary | ICD-10-CM | POA: Diagnosis not present

## 2019-12-29 DIAGNOSIS — Z6823 Body mass index (BMI) 23.0-23.9, adult: Secondary | ICD-10-CM | POA: Diagnosis not present

## 2020-02-22 DIAGNOSIS — Z87891 Personal history of nicotine dependence: Secondary | ICD-10-CM | POA: Diagnosis not present

## 2020-02-22 DIAGNOSIS — J441 Chronic obstructive pulmonary disease with (acute) exacerbation: Secondary | ICD-10-CM | POA: Diagnosis not present

## 2020-02-22 DIAGNOSIS — I110 Hypertensive heart disease with heart failure: Secondary | ICD-10-CM | POA: Diagnosis not present

## 2020-02-22 DIAGNOSIS — I5021 Acute systolic (congestive) heart failure: Secondary | ICD-10-CM | POA: Diagnosis not present

## 2020-02-22 DIAGNOSIS — J9611 Chronic respiratory failure with hypoxia: Secondary | ICD-10-CM | POA: Diagnosis not present

## 2020-03-08 DIAGNOSIS — Z85828 Personal history of other malignant neoplasm of skin: Secondary | ICD-10-CM | POA: Diagnosis not present

## 2020-03-08 DIAGNOSIS — R58 Hemorrhage, not elsewhere classified: Secondary | ICD-10-CM | POA: Diagnosis not present

## 2020-03-08 DIAGNOSIS — L905 Scar conditions and fibrosis of skin: Secondary | ICD-10-CM | POA: Diagnosis not present

## 2020-03-08 DIAGNOSIS — L853 Xerosis cutis: Secondary | ICD-10-CM | POA: Diagnosis not present

## 2020-03-08 DIAGNOSIS — D1801 Hemangioma of skin and subcutaneous tissue: Secondary | ICD-10-CM | POA: Diagnosis not present

## 2020-03-08 DIAGNOSIS — L821 Other seborrheic keratosis: Secondary | ICD-10-CM | POA: Diagnosis not present

## 2020-03-08 DIAGNOSIS — D485 Neoplasm of uncertain behavior of skin: Secondary | ICD-10-CM | POA: Diagnosis not present

## 2020-03-08 DIAGNOSIS — C44319 Basal cell carcinoma of skin of other parts of face: Secondary | ICD-10-CM | POA: Diagnosis not present

## 2020-03-08 DIAGNOSIS — L570 Actinic keratosis: Secondary | ICD-10-CM | POA: Diagnosis not present

## 2020-03-16 DIAGNOSIS — Z23 Encounter for immunization: Secondary | ICD-10-CM | POA: Diagnosis not present

## 2020-03-23 DIAGNOSIS — J441 Chronic obstructive pulmonary disease with (acute) exacerbation: Secondary | ICD-10-CM | POA: Diagnosis not present

## 2020-03-23 DIAGNOSIS — J9611 Chronic respiratory failure with hypoxia: Secondary | ICD-10-CM | POA: Diagnosis not present

## 2020-03-23 DIAGNOSIS — Z87891 Personal history of nicotine dependence: Secondary | ICD-10-CM | POA: Diagnosis not present

## 2020-03-23 DIAGNOSIS — I110 Hypertensive heart disease with heart failure: Secondary | ICD-10-CM | POA: Diagnosis not present

## 2020-03-23 DIAGNOSIS — I5021 Acute systolic (congestive) heart failure: Secondary | ICD-10-CM | POA: Diagnosis not present

## 2020-04-16 DIAGNOSIS — Z23 Encounter for immunization: Secondary | ICD-10-CM | POA: Diagnosis not present

## 2020-04-23 DIAGNOSIS — I5021 Acute systolic (congestive) heart failure: Secondary | ICD-10-CM | POA: Diagnosis not present

## 2020-04-23 DIAGNOSIS — J441 Chronic obstructive pulmonary disease with (acute) exacerbation: Secondary | ICD-10-CM | POA: Diagnosis not present

## 2020-04-23 DIAGNOSIS — I110 Hypertensive heart disease with heart failure: Secondary | ICD-10-CM | POA: Diagnosis not present

## 2020-04-23 DIAGNOSIS — J9611 Chronic respiratory failure with hypoxia: Secondary | ICD-10-CM | POA: Diagnosis not present

## 2020-04-29 DIAGNOSIS — C44311 Basal cell carcinoma of skin of nose: Secondary | ICD-10-CM | POA: Diagnosis not present

## 2020-05-24 DIAGNOSIS — I110 Hypertensive heart disease with heart failure: Secondary | ICD-10-CM | POA: Diagnosis not present

## 2020-05-24 DIAGNOSIS — J441 Chronic obstructive pulmonary disease with (acute) exacerbation: Secondary | ICD-10-CM | POA: Diagnosis not present

## 2020-05-24 DIAGNOSIS — J9611 Chronic respiratory failure with hypoxia: Secondary | ICD-10-CM | POA: Diagnosis not present

## 2020-05-24 DIAGNOSIS — I5021 Acute systolic (congestive) heart failure: Secondary | ICD-10-CM | POA: Diagnosis not present

## 2020-06-25 DIAGNOSIS — I10 Essential (primary) hypertension: Secondary | ICD-10-CM | POA: Diagnosis not present

## 2020-06-25 DIAGNOSIS — J440 Chronic obstructive pulmonary disease with acute lower respiratory infection: Secondary | ICD-10-CM | POA: Diagnosis not present

## 2020-06-25 DIAGNOSIS — E559 Vitamin D deficiency, unspecified: Secondary | ICD-10-CM | POA: Diagnosis not present

## 2020-06-25 DIAGNOSIS — E782 Mixed hyperlipidemia: Secondary | ICD-10-CM | POA: Diagnosis not present

## 2020-06-25 DIAGNOSIS — I5032 Chronic diastolic (congestive) heart failure: Secondary | ICD-10-CM | POA: Diagnosis not present

## 2020-06-25 DIAGNOSIS — E7849 Other hyperlipidemia: Secondary | ICD-10-CM | POA: Diagnosis not present

## 2020-06-28 DIAGNOSIS — I5020 Unspecified systolic (congestive) heart failure: Secondary | ICD-10-CM | POA: Diagnosis not present

## 2020-06-28 DIAGNOSIS — J479 Bronchiectasis, uncomplicated: Secondary | ICD-10-CM | POA: Diagnosis not present

## 2020-06-28 DIAGNOSIS — E7849 Other hyperlipidemia: Secondary | ICD-10-CM | POA: Diagnosis not present

## 2020-06-28 DIAGNOSIS — Z1389 Encounter for screening for other disorder: Secondary | ICD-10-CM | POA: Diagnosis not present

## 2020-06-28 DIAGNOSIS — R918 Other nonspecific abnormal finding of lung field: Secondary | ICD-10-CM | POA: Diagnosis not present

## 2020-06-28 DIAGNOSIS — I700 Atherosclerosis of aorta: Secondary | ICD-10-CM | POA: Diagnosis not present

## 2020-06-28 DIAGNOSIS — Z1331 Encounter for screening for depression: Secondary | ICD-10-CM | POA: Diagnosis not present

## 2020-06-28 DIAGNOSIS — J439 Emphysema, unspecified: Secondary | ICD-10-CM | POA: Diagnosis not present

## 2020-07-02 DIAGNOSIS — Z1379 Encounter for other screening for genetic and chromosomal anomalies: Secondary | ICD-10-CM | POA: Diagnosis not present

## 2020-07-02 DIAGNOSIS — Z8546 Personal history of malignant neoplasm of prostate: Secondary | ICD-10-CM | POA: Diagnosis not present

## 2020-07-02 DIAGNOSIS — Z808 Family history of malignant neoplasm of other organs or systems: Secondary | ICD-10-CM | POA: Diagnosis not present

## 2020-07-18 DIAGNOSIS — R9389 Abnormal findings on diagnostic imaging of other specified body structures: Secondary | ICD-10-CM | POA: Diagnosis not present

## 2020-07-18 DIAGNOSIS — J9611 Chronic respiratory failure with hypoxia: Secondary | ICD-10-CM | POA: Diagnosis not present

## 2020-07-18 DIAGNOSIS — Z122 Encounter for screening for malignant neoplasm of respiratory organs: Secondary | ICD-10-CM | POA: Diagnosis not present

## 2020-07-18 DIAGNOSIS — J449 Chronic obstructive pulmonary disease, unspecified: Secondary | ICD-10-CM | POA: Diagnosis not present

## 2020-07-18 DIAGNOSIS — J301 Allergic rhinitis due to pollen: Secondary | ICD-10-CM | POA: Diagnosis not present

## 2020-07-18 DIAGNOSIS — F17210 Nicotine dependence, cigarettes, uncomplicated: Secondary | ICD-10-CM | POA: Diagnosis not present

## 2020-07-26 DIAGNOSIS — R197 Diarrhea, unspecified: Secondary | ICD-10-CM | POA: Diagnosis not present

## 2020-07-30 DIAGNOSIS — R197 Diarrhea, unspecified: Secondary | ICD-10-CM | POA: Diagnosis not present

## 2020-08-21 DIAGNOSIS — I5021 Acute systolic (congestive) heart failure: Secondary | ICD-10-CM | POA: Diagnosis not present

## 2020-08-21 DIAGNOSIS — J441 Chronic obstructive pulmonary disease with (acute) exacerbation: Secondary | ICD-10-CM | POA: Diagnosis not present

## 2020-08-21 DIAGNOSIS — J9611 Chronic respiratory failure with hypoxia: Secondary | ICD-10-CM | POA: Diagnosis not present

## 2020-08-21 DIAGNOSIS — I110 Hypertensive heart disease with heart failure: Secondary | ICD-10-CM | POA: Diagnosis not present

## 2020-08-21 DIAGNOSIS — Z87891 Personal history of nicotine dependence: Secondary | ICD-10-CM | POA: Diagnosis not present

## 2020-08-23 DIAGNOSIS — D229 Melanocytic nevi, unspecified: Secondary | ICD-10-CM | POA: Diagnosis not present

## 2020-08-23 DIAGNOSIS — Z85828 Personal history of other malignant neoplasm of skin: Secondary | ICD-10-CM | POA: Diagnosis not present

## 2020-08-23 DIAGNOSIS — L738 Other specified follicular disorders: Secondary | ICD-10-CM | POA: Diagnosis not present

## 2020-08-23 DIAGNOSIS — L570 Actinic keratosis: Secondary | ICD-10-CM | POA: Diagnosis not present

## 2020-08-23 DIAGNOSIS — L853 Xerosis cutis: Secondary | ICD-10-CM | POA: Diagnosis not present

## 2020-08-23 DIAGNOSIS — L821 Other seborrheic keratosis: Secondary | ICD-10-CM | POA: Diagnosis not present

## 2020-08-23 DIAGNOSIS — L905 Scar conditions and fibrosis of skin: Secondary | ICD-10-CM | POA: Diagnosis not present

## 2020-08-23 DIAGNOSIS — D1801 Hemangioma of skin and subcutaneous tissue: Secondary | ICD-10-CM | POA: Diagnosis not present

## 2020-09-02 DIAGNOSIS — Z8546 Personal history of malignant neoplasm of prostate: Secondary | ICD-10-CM | POA: Diagnosis not present

## 2020-09-02 DIAGNOSIS — Z86711 Personal history of pulmonary embolism: Secondary | ICD-10-CM | POA: Diagnosis not present

## 2020-09-02 DIAGNOSIS — Z9889 Other specified postprocedural states: Secondary | ICD-10-CM | POA: Diagnosis not present

## 2020-09-02 DIAGNOSIS — Z79818 Long term (current) use of other agents affecting estrogen receptors and estrogen levels: Secondary | ICD-10-CM | POA: Diagnosis not present

## 2020-09-02 DIAGNOSIS — Z8616 Personal history of COVID-19: Secondary | ICD-10-CM | POA: Diagnosis not present

## 2020-09-02 DIAGNOSIS — R771 Abnormality of globulin: Secondary | ICD-10-CM | POA: Diagnosis not present

## 2020-09-02 DIAGNOSIS — D649 Anemia, unspecified: Secondary | ICD-10-CM | POA: Diagnosis not present

## 2020-09-02 DIAGNOSIS — Z7902 Long term (current) use of antithrombotics/antiplatelets: Secondary | ICD-10-CM | POA: Diagnosis not present

## 2020-09-02 DIAGNOSIS — Z87891 Personal history of nicotine dependence: Secondary | ICD-10-CM | POA: Diagnosis not present

## 2020-09-02 DIAGNOSIS — Z79899 Other long term (current) drug therapy: Secondary | ICD-10-CM | POA: Diagnosis not present

## 2020-09-02 DIAGNOSIS — Z7951 Long term (current) use of inhaled steroids: Secondary | ICD-10-CM | POA: Diagnosis not present

## 2020-09-02 DIAGNOSIS — K219 Gastro-esophageal reflux disease without esophagitis: Secondary | ICD-10-CM | POA: Diagnosis not present

## 2020-09-02 DIAGNOSIS — Z7982 Long term (current) use of aspirin: Secondary | ICD-10-CM | POA: Diagnosis not present

## 2020-09-18 DIAGNOSIS — R197 Diarrhea, unspecified: Secondary | ICD-10-CM | POA: Diagnosis not present

## 2020-09-20 DIAGNOSIS — Z7982 Long term (current) use of aspirin: Secondary | ICD-10-CM | POA: Diagnosis not present

## 2020-09-20 DIAGNOSIS — Z7902 Long term (current) use of antithrombotics/antiplatelets: Secondary | ICD-10-CM | POA: Diagnosis not present

## 2020-09-20 DIAGNOSIS — Z8546 Personal history of malignant neoplasm of prostate: Secondary | ICD-10-CM | POA: Diagnosis not present

## 2020-09-20 DIAGNOSIS — Z79818 Long term (current) use of other agents affecting estrogen receptors and estrogen levels: Secondary | ICD-10-CM | POA: Diagnosis not present

## 2020-09-20 DIAGNOSIS — Z7951 Long term (current) use of inhaled steroids: Secondary | ICD-10-CM | POA: Diagnosis not present

## 2020-09-20 DIAGNOSIS — R771 Abnormality of globulin: Secondary | ICD-10-CM | POA: Diagnosis not present

## 2020-09-21 DIAGNOSIS — I5021 Acute systolic (congestive) heart failure: Secondary | ICD-10-CM | POA: Diagnosis not present

## 2020-09-21 DIAGNOSIS — J9611 Chronic respiratory failure with hypoxia: Secondary | ICD-10-CM | POA: Diagnosis not present

## 2020-09-21 DIAGNOSIS — J441 Chronic obstructive pulmonary disease with (acute) exacerbation: Secondary | ICD-10-CM | POA: Diagnosis not present

## 2020-09-21 DIAGNOSIS — Z87891 Personal history of nicotine dependence: Secondary | ICD-10-CM | POA: Diagnosis not present

## 2020-09-21 DIAGNOSIS — I110 Hypertensive heart disease with heart failure: Secondary | ICD-10-CM | POA: Diagnosis not present

## 2020-09-27 DIAGNOSIS — Z1211 Encounter for screening for malignant neoplasm of colon: Secondary | ICD-10-CM | POA: Diagnosis not present

## 2020-09-27 DIAGNOSIS — J449 Chronic obstructive pulmonary disease, unspecified: Secondary | ICD-10-CM | POA: Diagnosis not present

## 2020-09-27 DIAGNOSIS — K219 Gastro-esophageal reflux disease without esophagitis: Secondary | ICD-10-CM | POA: Diagnosis not present

## 2020-09-27 DIAGNOSIS — I10 Essential (primary) hypertension: Secondary | ICD-10-CM | POA: Diagnosis not present

## 2020-09-27 DIAGNOSIS — K529 Noninfective gastroenteritis and colitis, unspecified: Secondary | ICD-10-CM | POA: Diagnosis not present

## 2020-09-27 DIAGNOSIS — R197 Diarrhea, unspecified: Secondary | ICD-10-CM | POA: Diagnosis not present

## 2020-09-27 DIAGNOSIS — K921 Melena: Secondary | ICD-10-CM | POA: Diagnosis not present

## 2020-09-27 DIAGNOSIS — D122 Benign neoplasm of ascending colon: Secondary | ICD-10-CM | POA: Diagnosis not present

## 2020-10-04 DIAGNOSIS — Z87891 Personal history of nicotine dependence: Secondary | ICD-10-CM | POA: Diagnosis not present

## 2020-10-04 DIAGNOSIS — D75839 Thrombocytosis, unspecified: Secondary | ICD-10-CM | POA: Diagnosis not present

## 2020-10-04 DIAGNOSIS — Z7952 Long term (current) use of systemic steroids: Secondary | ICD-10-CM | POA: Diagnosis not present

## 2020-10-04 DIAGNOSIS — E7800 Pure hypercholesterolemia, unspecified: Secondary | ICD-10-CM | POA: Diagnosis not present

## 2020-10-04 DIAGNOSIS — D649 Anemia, unspecified: Secondary | ICD-10-CM | POA: Diagnosis not present

## 2020-10-04 DIAGNOSIS — D720 Genetic anomalies of leukocytes: Secondary | ICD-10-CM | POA: Diagnosis not present

## 2020-10-04 DIAGNOSIS — D892 Hypergammaglobulinemia, unspecified: Secondary | ICD-10-CM | POA: Diagnosis not present

## 2020-10-04 DIAGNOSIS — D704 Cyclic neutropenia: Secondary | ICD-10-CM | POA: Diagnosis not present

## 2020-10-04 DIAGNOSIS — Z01812 Encounter for preprocedural laboratory examination: Secondary | ICD-10-CM | POA: Diagnosis not present

## 2020-10-04 DIAGNOSIS — Z8546 Personal history of malignant neoplasm of prostate: Secondary | ICD-10-CM | POA: Diagnosis not present

## 2020-10-04 DIAGNOSIS — D472 Monoclonal gammopathy: Secondary | ICD-10-CM | POA: Diagnosis not present

## 2020-10-04 DIAGNOSIS — Z86711 Personal history of pulmonary embolism: Secondary | ICD-10-CM | POA: Diagnosis not present

## 2020-10-04 DIAGNOSIS — M1990 Unspecified osteoarthritis, unspecified site: Secondary | ICD-10-CM | POA: Diagnosis not present

## 2020-10-04 DIAGNOSIS — Z7901 Long term (current) use of anticoagulants: Secondary | ICD-10-CM | POA: Diagnosis not present

## 2020-10-04 DIAGNOSIS — J449 Chronic obstructive pulmonary disease, unspecified: Secondary | ICD-10-CM | POA: Diagnosis not present

## 2020-10-04 DIAGNOSIS — Z7982 Long term (current) use of aspirin: Secondary | ICD-10-CM | POA: Diagnosis not present

## 2020-10-15 DIAGNOSIS — E7849 Other hyperlipidemia: Secondary | ICD-10-CM | POA: Diagnosis not present

## 2020-10-15 DIAGNOSIS — D509 Iron deficiency anemia, unspecified: Secondary | ICD-10-CM | POA: Diagnosis not present

## 2020-10-15 DIAGNOSIS — E782 Mixed hyperlipidemia: Secondary | ICD-10-CM | POA: Diagnosis not present

## 2020-10-15 DIAGNOSIS — D519 Vitamin B12 deficiency anemia, unspecified: Secondary | ICD-10-CM | POA: Diagnosis not present

## 2020-10-15 DIAGNOSIS — D649 Anemia, unspecified: Secondary | ICD-10-CM | POA: Diagnosis not present

## 2020-10-15 DIAGNOSIS — E559 Vitamin D deficiency, unspecified: Secondary | ICD-10-CM | POA: Diagnosis not present

## 2020-10-18 DIAGNOSIS — J449 Chronic obstructive pulmonary disease, unspecified: Secondary | ICD-10-CM | POA: Diagnosis not present

## 2020-10-18 DIAGNOSIS — R771 Abnormality of globulin: Secondary | ICD-10-CM | POA: Diagnosis not present

## 2020-10-18 DIAGNOSIS — Z7982 Long term (current) use of aspirin: Secondary | ICD-10-CM | POA: Diagnosis not present

## 2020-10-18 DIAGNOSIS — D649 Anemia, unspecified: Secondary | ICD-10-CM | POA: Diagnosis not present

## 2020-10-18 DIAGNOSIS — K5190 Ulcerative colitis, unspecified, without complications: Secondary | ICD-10-CM | POA: Diagnosis not present

## 2020-10-18 DIAGNOSIS — Z951 Presence of aortocoronary bypass graft: Secondary | ICD-10-CM | POA: Diagnosis not present

## 2020-10-18 DIAGNOSIS — D472 Monoclonal gammopathy: Secondary | ICD-10-CM | POA: Diagnosis not present

## 2020-10-21 DIAGNOSIS — I5021 Acute systolic (congestive) heart failure: Secondary | ICD-10-CM | POA: Diagnosis not present

## 2020-10-21 DIAGNOSIS — J9611 Chronic respiratory failure with hypoxia: Secondary | ICD-10-CM | POA: Diagnosis not present

## 2020-10-21 DIAGNOSIS — Z87891 Personal history of nicotine dependence: Secondary | ICD-10-CM | POA: Diagnosis not present

## 2020-10-21 DIAGNOSIS — I110 Hypertensive heart disease with heart failure: Secondary | ICD-10-CM | POA: Diagnosis not present

## 2020-10-21 DIAGNOSIS — J441 Chronic obstructive pulmonary disease with (acute) exacerbation: Secondary | ICD-10-CM | POA: Diagnosis not present

## 2020-10-24 DIAGNOSIS — K5010 Crohn's disease of large intestine without complications: Secondary | ICD-10-CM | POA: Diagnosis not present

## 2020-10-24 DIAGNOSIS — K219 Gastro-esophageal reflux disease without esophagitis: Secondary | ICD-10-CM | POA: Diagnosis not present

## 2020-10-30 DIAGNOSIS — J479 Bronchiectasis, uncomplicated: Secondary | ICD-10-CM | POA: Diagnosis not present

## 2020-10-30 DIAGNOSIS — R059 Cough, unspecified: Secondary | ICD-10-CM | POA: Diagnosis not present

## 2020-10-30 DIAGNOSIS — Z20828 Contact with and (suspected) exposure to other viral communicable diseases: Secondary | ICD-10-CM | POA: Diagnosis not present

## 2020-10-30 DIAGNOSIS — I5020 Unspecified systolic (congestive) heart failure: Secondary | ICD-10-CM | POA: Diagnosis not present

## 2020-10-30 DIAGNOSIS — J439 Emphysema, unspecified: Secondary | ICD-10-CM | POA: Diagnosis not present

## 2020-10-30 DIAGNOSIS — D509 Iron deficiency anemia, unspecified: Secondary | ICD-10-CM | POA: Diagnosis not present

## 2020-10-30 DIAGNOSIS — K5190 Ulcerative colitis, unspecified, without complications: Secondary | ICD-10-CM | POA: Diagnosis not present

## 2020-10-30 DIAGNOSIS — J9611 Chronic respiratory failure with hypoxia: Secondary | ICD-10-CM | POA: Diagnosis not present

## 2020-11-11 DIAGNOSIS — R0902 Hypoxemia: Secondary | ICD-10-CM | POA: Diagnosis not present

## 2020-11-11 DIAGNOSIS — C61 Malignant neoplasm of prostate: Secondary | ICD-10-CM | POA: Diagnosis not present

## 2020-11-11 DIAGNOSIS — F419 Anxiety disorder, unspecified: Secondary | ICD-10-CM | POA: Diagnosis not present

## 2020-11-11 DIAGNOSIS — J449 Chronic obstructive pulmonary disease, unspecified: Secondary | ICD-10-CM | POA: Diagnosis not present

## 2020-11-11 DIAGNOSIS — R0600 Dyspnea, unspecified: Secondary | ICD-10-CM | POA: Diagnosis not present

## 2020-11-11 DIAGNOSIS — K5010 Crohn's disease of large intestine without complications: Secondary | ICD-10-CM | POA: Diagnosis not present

## 2020-11-11 DIAGNOSIS — Z882 Allergy status to sulfonamides status: Secondary | ICD-10-CM | POA: Diagnosis not present

## 2020-11-11 DIAGNOSIS — K219 Gastro-esophageal reflux disease without esophagitis: Secondary | ICD-10-CM | POA: Diagnosis not present

## 2020-11-11 DIAGNOSIS — Z87891 Personal history of nicotine dependence: Secondary | ICD-10-CM | POA: Diagnosis not present

## 2020-11-11 DIAGNOSIS — J9621 Acute and chronic respiratory failure with hypoxia: Secondary | ICD-10-CM | POA: Diagnosis not present

## 2020-11-11 DIAGNOSIS — J441 Chronic obstructive pulmonary disease with (acute) exacerbation: Secondary | ICD-10-CM | POA: Diagnosis not present

## 2020-11-11 DIAGNOSIS — J189 Pneumonia, unspecified organism: Secondary | ICD-10-CM | POA: Diagnosis not present

## 2020-11-11 DIAGNOSIS — D509 Iron deficiency anemia, unspecified: Secondary | ICD-10-CM | POA: Diagnosis not present

## 2020-11-11 DIAGNOSIS — E785 Hyperlipidemia, unspecified: Secondary | ICD-10-CM | POA: Diagnosis not present

## 2020-11-11 DIAGNOSIS — J188 Other pneumonia, unspecified organism: Secondary | ICD-10-CM | POA: Diagnosis not present

## 2020-11-11 DIAGNOSIS — R000 Tachycardia, unspecified: Secondary | ICD-10-CM | POA: Diagnosis not present

## 2020-11-12 DIAGNOSIS — J9621 Acute and chronic respiratory failure with hypoxia: Secondary | ICD-10-CM | POA: Diagnosis not present

## 2020-11-12 DIAGNOSIS — Z87891 Personal history of nicotine dependence: Secondary | ICD-10-CM | POA: Diagnosis not present

## 2020-11-12 DIAGNOSIS — J189 Pneumonia, unspecified organism: Secondary | ICD-10-CM | POA: Diagnosis not present

## 2020-11-12 DIAGNOSIS — J441 Chronic obstructive pulmonary disease with (acute) exacerbation: Secondary | ICD-10-CM | POA: Diagnosis not present

## 2020-11-12 DIAGNOSIS — J449 Chronic obstructive pulmonary disease, unspecified: Secondary | ICD-10-CM | POA: Diagnosis not present

## 2020-11-13 DIAGNOSIS — Z7982 Long term (current) use of aspirin: Secondary | ICD-10-CM | POA: Diagnosis not present

## 2020-11-13 DIAGNOSIS — J432 Centrilobular emphysema: Secondary | ICD-10-CM | POA: Diagnosis not present

## 2020-11-13 DIAGNOSIS — Z9981 Dependence on supplemental oxygen: Secondary | ICD-10-CM | POA: Diagnosis not present

## 2020-11-13 DIAGNOSIS — F419 Anxiety disorder, unspecified: Secondary | ICD-10-CM | POA: Diagnosis not present

## 2020-11-13 DIAGNOSIS — C61 Malignant neoplasm of prostate: Secondary | ICD-10-CM | POA: Diagnosis not present

## 2020-11-13 DIAGNOSIS — J9621 Acute and chronic respiratory failure with hypoxia: Secondary | ICD-10-CM | POA: Diagnosis not present

## 2020-11-13 DIAGNOSIS — Z87891 Personal history of nicotine dependence: Secondary | ICD-10-CM | POA: Diagnosis not present

## 2020-11-13 DIAGNOSIS — J189 Pneumonia, unspecified organism: Secondary | ICD-10-CM | POA: Diagnosis not present

## 2020-11-19 DIAGNOSIS — F419 Anxiety disorder, unspecified: Secondary | ICD-10-CM | POA: Diagnosis not present

## 2020-11-19 DIAGNOSIS — Z7982 Long term (current) use of aspirin: Secondary | ICD-10-CM | POA: Diagnosis not present

## 2020-11-19 DIAGNOSIS — J189 Pneumonia, unspecified organism: Secondary | ICD-10-CM | POA: Diagnosis not present

## 2020-11-19 DIAGNOSIS — J432 Centrilobular emphysema: Secondary | ICD-10-CM | POA: Diagnosis not present

## 2020-11-19 DIAGNOSIS — J9621 Acute and chronic respiratory failure with hypoxia: Secondary | ICD-10-CM | POA: Diagnosis not present

## 2020-11-19 DIAGNOSIS — C61 Malignant neoplasm of prostate: Secondary | ICD-10-CM | POA: Diagnosis not present

## 2020-11-21 DIAGNOSIS — I5021 Acute systolic (congestive) heart failure: Secondary | ICD-10-CM | POA: Diagnosis not present

## 2020-11-21 DIAGNOSIS — J9611 Chronic respiratory failure with hypoxia: Secondary | ICD-10-CM | POA: Diagnosis not present

## 2020-11-21 DIAGNOSIS — J441 Chronic obstructive pulmonary disease with (acute) exacerbation: Secondary | ICD-10-CM | POA: Diagnosis not present

## 2020-11-21 DIAGNOSIS — I110 Hypertensive heart disease with heart failure: Secondary | ICD-10-CM | POA: Diagnosis not present

## 2020-11-21 DIAGNOSIS — Z87891 Personal history of nicotine dependence: Secondary | ICD-10-CM | POA: Diagnosis not present

## 2020-11-22 DIAGNOSIS — J441 Chronic obstructive pulmonary disease with (acute) exacerbation: Secondary | ICD-10-CM | POA: Diagnosis not present

## 2020-11-22 DIAGNOSIS — I10 Essential (primary) hypertension: Secondary | ICD-10-CM | POA: Diagnosis not present

## 2020-11-22 DIAGNOSIS — J439 Emphysema, unspecified: Secondary | ICD-10-CM | POA: Diagnosis not present

## 2020-11-22 DIAGNOSIS — Z681 Body mass index (BMI) 19 or less, adult: Secondary | ICD-10-CM | POA: Diagnosis not present

## 2020-11-22 DIAGNOSIS — R911 Solitary pulmonary nodule: Secondary | ICD-10-CM | POA: Diagnosis not present

## 2020-11-22 DIAGNOSIS — J9621 Acute and chronic respiratory failure with hypoxia: Secondary | ICD-10-CM | POA: Diagnosis not present

## 2020-11-22 DIAGNOSIS — K5090 Crohn's disease, unspecified, without complications: Secondary | ICD-10-CM | POA: Diagnosis not present

## 2020-11-24 DIAGNOSIS — J441 Chronic obstructive pulmonary disease with (acute) exacerbation: Secondary | ICD-10-CM | POA: Diagnosis not present

## 2020-11-24 DIAGNOSIS — Z9981 Dependence on supplemental oxygen: Secondary | ICD-10-CM | POA: Diagnosis not present

## 2020-11-24 DIAGNOSIS — Z882 Allergy status to sulfonamides status: Secondary | ICD-10-CM | POA: Diagnosis not present

## 2020-11-24 DIAGNOSIS — R0600 Dyspnea, unspecified: Secondary | ICD-10-CM | POA: Diagnosis not present

## 2020-11-24 DIAGNOSIS — Z20822 Contact with and (suspected) exposure to covid-19: Secondary | ICD-10-CM | POA: Diagnosis not present

## 2020-11-26 DIAGNOSIS — J189 Pneumonia, unspecified organism: Secondary | ICD-10-CM | POA: Diagnosis not present

## 2020-11-26 DIAGNOSIS — Z7982 Long term (current) use of aspirin: Secondary | ICD-10-CM | POA: Diagnosis not present

## 2020-11-26 DIAGNOSIS — J432 Centrilobular emphysema: Secondary | ICD-10-CM | POA: Diagnosis not present

## 2020-11-26 DIAGNOSIS — F419 Anxiety disorder, unspecified: Secondary | ICD-10-CM | POA: Diagnosis not present

## 2020-11-26 DIAGNOSIS — C61 Malignant neoplasm of prostate: Secondary | ICD-10-CM | POA: Diagnosis not present

## 2020-11-26 DIAGNOSIS — J9621 Acute and chronic respiratory failure with hypoxia: Secondary | ICD-10-CM | POA: Diagnosis not present

## 2020-11-27 DIAGNOSIS — Z20822 Contact with and (suspected) exposure to covid-19: Secondary | ICD-10-CM | POA: Diagnosis not present

## 2020-11-27 DIAGNOSIS — F419 Anxiety disorder, unspecified: Secondary | ICD-10-CM | POA: Diagnosis not present

## 2020-11-27 DIAGNOSIS — J441 Chronic obstructive pulmonary disease with (acute) exacerbation: Secondary | ICD-10-CM | POA: Diagnosis not present

## 2020-11-27 DIAGNOSIS — R0602 Shortness of breath: Secondary | ICD-10-CM | POA: Diagnosis not present

## 2020-11-27 DIAGNOSIS — Z79899 Other long term (current) drug therapy: Secondary | ICD-10-CM | POA: Diagnosis not present

## 2020-11-27 DIAGNOSIS — R079 Chest pain, unspecified: Secondary | ICD-10-CM | POA: Diagnosis not present

## 2020-11-28 DIAGNOSIS — R0602 Shortness of breath: Secondary | ICD-10-CM | POA: Diagnosis not present

## 2020-11-28 DIAGNOSIS — J441 Chronic obstructive pulmonary disease with (acute) exacerbation: Secondary | ICD-10-CM | POA: Diagnosis not present

## 2020-12-05 DIAGNOSIS — J432 Centrilobular emphysema: Secondary | ICD-10-CM | POA: Diagnosis not present

## 2020-12-05 DIAGNOSIS — Z7982 Long term (current) use of aspirin: Secondary | ICD-10-CM | POA: Diagnosis not present

## 2020-12-05 DIAGNOSIS — C61 Malignant neoplasm of prostate: Secondary | ICD-10-CM | POA: Diagnosis not present

## 2020-12-05 DIAGNOSIS — J189 Pneumonia, unspecified organism: Secondary | ICD-10-CM | POA: Diagnosis not present

## 2020-12-05 DIAGNOSIS — F419 Anxiety disorder, unspecified: Secondary | ICD-10-CM | POA: Diagnosis not present

## 2020-12-05 DIAGNOSIS — J9621 Acute and chronic respiratory failure with hypoxia: Secondary | ICD-10-CM | POA: Diagnosis not present

## 2020-12-09 DIAGNOSIS — J9621 Acute and chronic respiratory failure with hypoxia: Secondary | ICD-10-CM | POA: Diagnosis not present

## 2020-12-09 DIAGNOSIS — J432 Centrilobular emphysema: Secondary | ICD-10-CM | POA: Diagnosis not present

## 2020-12-09 DIAGNOSIS — C61 Malignant neoplasm of prostate: Secondary | ICD-10-CM | POA: Diagnosis not present

## 2020-12-09 DIAGNOSIS — J189 Pneumonia, unspecified organism: Secondary | ICD-10-CM | POA: Diagnosis not present

## 2020-12-09 DIAGNOSIS — Z7982 Long term (current) use of aspirin: Secondary | ICD-10-CM | POA: Diagnosis not present

## 2020-12-09 DIAGNOSIS — F419 Anxiety disorder, unspecified: Secondary | ICD-10-CM | POA: Diagnosis not present

## 2020-12-12 DIAGNOSIS — F17201 Nicotine dependence, unspecified, in remission: Secondary | ICD-10-CM | POA: Diagnosis not present

## 2020-12-12 DIAGNOSIS — J449 Chronic obstructive pulmonary disease, unspecified: Secondary | ICD-10-CM | POA: Diagnosis not present

## 2020-12-12 DIAGNOSIS — J479 Bronchiectasis, uncomplicated: Secondary | ICD-10-CM | POA: Diagnosis not present

## 2020-12-12 DIAGNOSIS — J9611 Chronic respiratory failure with hypoxia: Secondary | ICD-10-CM | POA: Diagnosis not present

## 2020-12-12 DIAGNOSIS — M4854XA Collapsed vertebra, not elsewhere classified, thoracic region, initial encounter for fracture: Secondary | ICD-10-CM | POA: Diagnosis not present

## 2020-12-13 DIAGNOSIS — C61 Malignant neoplasm of prostate: Secondary | ICD-10-CM | POA: Diagnosis not present

## 2020-12-13 DIAGNOSIS — J189 Pneumonia, unspecified organism: Secondary | ICD-10-CM | POA: Diagnosis not present

## 2020-12-13 DIAGNOSIS — F419 Anxiety disorder, unspecified: Secondary | ICD-10-CM | POA: Diagnosis not present

## 2020-12-13 DIAGNOSIS — Z87891 Personal history of nicotine dependence: Secondary | ICD-10-CM | POA: Diagnosis not present

## 2020-12-13 DIAGNOSIS — Z7982 Long term (current) use of aspirin: Secondary | ICD-10-CM | POA: Diagnosis not present

## 2020-12-13 DIAGNOSIS — Z9981 Dependence on supplemental oxygen: Secondary | ICD-10-CM | POA: Diagnosis not present

## 2020-12-13 DIAGNOSIS — J9621 Acute and chronic respiratory failure with hypoxia: Secondary | ICD-10-CM | POA: Diagnosis not present

## 2020-12-13 DIAGNOSIS — J432 Centrilobular emphysema: Secondary | ICD-10-CM | POA: Diagnosis not present

## 2020-12-17 DIAGNOSIS — J9621 Acute and chronic respiratory failure with hypoxia: Secondary | ICD-10-CM | POA: Diagnosis not present

## 2020-12-17 DIAGNOSIS — F419 Anxiety disorder, unspecified: Secondary | ICD-10-CM | POA: Diagnosis not present

## 2020-12-17 DIAGNOSIS — J432 Centrilobular emphysema: Secondary | ICD-10-CM | POA: Diagnosis not present

## 2020-12-17 DIAGNOSIS — Z7982 Long term (current) use of aspirin: Secondary | ICD-10-CM | POA: Diagnosis not present

## 2020-12-17 DIAGNOSIS — C61 Malignant neoplasm of prostate: Secondary | ICD-10-CM | POA: Diagnosis not present

## 2020-12-17 DIAGNOSIS — J189 Pneumonia, unspecified organism: Secondary | ICD-10-CM | POA: Diagnosis not present

## 2020-12-22 DIAGNOSIS — I5021 Acute systolic (congestive) heart failure: Secondary | ICD-10-CM | POA: Diagnosis not present

## 2020-12-22 DIAGNOSIS — J9611 Chronic respiratory failure with hypoxia: Secondary | ICD-10-CM | POA: Diagnosis not present

## 2020-12-22 DIAGNOSIS — I110 Hypertensive heart disease with heart failure: Secondary | ICD-10-CM | POA: Diagnosis not present

## 2020-12-22 DIAGNOSIS — J441 Chronic obstructive pulmonary disease with (acute) exacerbation: Secondary | ICD-10-CM | POA: Diagnosis not present

## 2020-12-22 DIAGNOSIS — Z87891 Personal history of nicotine dependence: Secondary | ICD-10-CM | POA: Diagnosis not present

## 2020-12-23 DIAGNOSIS — F419 Anxiety disorder, unspecified: Secondary | ICD-10-CM | POA: Diagnosis not present

## 2020-12-23 DIAGNOSIS — J9621 Acute and chronic respiratory failure with hypoxia: Secondary | ICD-10-CM | POA: Diagnosis not present

## 2020-12-23 DIAGNOSIS — C61 Malignant neoplasm of prostate: Secondary | ICD-10-CM | POA: Diagnosis not present

## 2020-12-23 DIAGNOSIS — J432 Centrilobular emphysema: Secondary | ICD-10-CM | POA: Diagnosis not present

## 2020-12-23 DIAGNOSIS — J189 Pneumonia, unspecified organism: Secondary | ICD-10-CM | POA: Diagnosis not present

## 2020-12-23 DIAGNOSIS — Z7982 Long term (current) use of aspirin: Secondary | ICD-10-CM | POA: Diagnosis not present

## 2020-12-26 DIAGNOSIS — N401 Enlarged prostate with lower urinary tract symptoms: Secondary | ICD-10-CM | POA: Diagnosis not present

## 2020-12-26 DIAGNOSIS — K5010 Crohn's disease of large intestine without complications: Secondary | ICD-10-CM | POA: Diagnosis not present

## 2020-12-26 DIAGNOSIS — Z23 Encounter for immunization: Secondary | ICD-10-CM | POA: Diagnosis not present

## 2020-12-26 DIAGNOSIS — C61 Malignant neoplasm of prostate: Secondary | ICD-10-CM | POA: Diagnosis not present

## 2020-12-26 DIAGNOSIS — K219 Gastro-esophageal reflux disease without esophagitis: Secondary | ICD-10-CM | POA: Diagnosis not present

## 2020-12-28 DIAGNOSIS — F17211 Nicotine dependence, cigarettes, in remission: Secondary | ICD-10-CM | POA: Diagnosis not present

## 2020-12-28 DIAGNOSIS — J9601 Acute respiratory failure with hypoxia: Secondary | ICD-10-CM | POA: Diagnosis not present

## 2020-12-28 DIAGNOSIS — J439 Emphysema, unspecified: Secondary | ICD-10-CM | POA: Diagnosis not present

## 2020-12-28 DIAGNOSIS — J441 Chronic obstructive pulmonary disease with (acute) exacerbation: Secondary | ICD-10-CM | POA: Diagnosis not present

## 2021-01-01 DIAGNOSIS — Z7982 Long term (current) use of aspirin: Secondary | ICD-10-CM | POA: Diagnosis not present

## 2021-01-01 DIAGNOSIS — Z23 Encounter for immunization: Secondary | ICD-10-CM | POA: Diagnosis not present

## 2021-01-01 DIAGNOSIS — Z8616 Personal history of COVID-19: Secondary | ICD-10-CM | POA: Diagnosis not present

## 2021-01-01 DIAGNOSIS — K5010 Crohn's disease of large intestine without complications: Secondary | ICD-10-CM | POA: Diagnosis not present

## 2021-01-01 DIAGNOSIS — J449 Chronic obstructive pulmonary disease, unspecified: Secondary | ICD-10-CM | POA: Diagnosis not present

## 2021-01-01 DIAGNOSIS — D649 Anemia, unspecified: Secondary | ICD-10-CM | POA: Diagnosis not present

## 2021-01-01 DIAGNOSIS — K219 Gastro-esophageal reflux disease without esophagitis: Secondary | ICD-10-CM | POA: Diagnosis not present

## 2021-01-01 DIAGNOSIS — Z7902 Long term (current) use of antithrombotics/antiplatelets: Secondary | ICD-10-CM | POA: Diagnosis not present

## 2021-01-01 DIAGNOSIS — K5090 Crohn's disease, unspecified, without complications: Secondary | ICD-10-CM | POA: Diagnosis not present

## 2021-01-01 DIAGNOSIS — Z79899 Other long term (current) drug therapy: Secondary | ICD-10-CM | POA: Diagnosis not present

## 2021-01-01 DIAGNOSIS — R771 Abnormality of globulin: Secondary | ICD-10-CM | POA: Diagnosis not present

## 2021-01-01 DIAGNOSIS — Z87891 Personal history of nicotine dependence: Secondary | ICD-10-CM | POA: Diagnosis not present

## 2021-01-01 DIAGNOSIS — I10 Essential (primary) hypertension: Secondary | ICD-10-CM | POA: Diagnosis not present

## 2021-01-01 DIAGNOSIS — Z7951 Long term (current) use of inhaled steroids: Secondary | ICD-10-CM | POA: Diagnosis not present

## 2021-01-01 DIAGNOSIS — D472 Monoclonal gammopathy: Secondary | ICD-10-CM | POA: Diagnosis not present

## 2021-01-01 DIAGNOSIS — Z8546 Personal history of malignant neoplasm of prostate: Secondary | ICD-10-CM | POA: Diagnosis not present

## 2021-01-10 DIAGNOSIS — R771 Abnormality of globulin: Secondary | ICD-10-CM | POA: Diagnosis not present

## 2021-01-15 DIAGNOSIS — K5010 Crohn's disease of large intestine without complications: Secondary | ICD-10-CM | POA: Diagnosis not present

## 2021-01-22 DIAGNOSIS — J9611 Chronic respiratory failure with hypoxia: Secondary | ICD-10-CM | POA: Diagnosis not present

## 2021-01-22 DIAGNOSIS — I5021 Acute systolic (congestive) heart failure: Secondary | ICD-10-CM | POA: Diagnosis not present

## 2021-01-22 DIAGNOSIS — I110 Hypertensive heart disease with heart failure: Secondary | ICD-10-CM | POA: Diagnosis not present

## 2021-01-22 DIAGNOSIS — M170 Bilateral primary osteoarthritis of knee: Secondary | ICD-10-CM | POA: Diagnosis not present

## 2021-02-07 DIAGNOSIS — K5010 Crohn's disease of large intestine without complications: Secondary | ICD-10-CM | POA: Diagnosis not present

## 2021-02-07 DIAGNOSIS — Z23 Encounter for immunization: Secondary | ICD-10-CM | POA: Diagnosis not present

## 2021-02-12 DIAGNOSIS — K5010 Crohn's disease of large intestine without complications: Secondary | ICD-10-CM | POA: Diagnosis not present

## 2021-02-20 DIAGNOSIS — M8589 Other specified disorders of bone density and structure, multiple sites: Secondary | ICD-10-CM | POA: Diagnosis not present

## 2021-02-20 DIAGNOSIS — M4854XA Collapsed vertebra, not elsewhere classified, thoracic region, initial encounter for fracture: Secondary | ICD-10-CM | POA: Diagnosis not present

## 2021-02-27 DIAGNOSIS — I10 Essential (primary) hypertension: Secondary | ICD-10-CM | POA: Diagnosis not present

## 2021-02-27 DIAGNOSIS — E782 Mixed hyperlipidemia: Secondary | ICD-10-CM | POA: Diagnosis not present

## 2021-02-27 DIAGNOSIS — R911 Solitary pulmonary nodule: Secondary | ICD-10-CM | POA: Diagnosis not present

## 2021-02-27 DIAGNOSIS — J439 Emphysema, unspecified: Secondary | ICD-10-CM | POA: Diagnosis not present

## 2021-02-27 DIAGNOSIS — E7849 Other hyperlipidemia: Secondary | ICD-10-CM | POA: Diagnosis not present

## 2021-02-27 DIAGNOSIS — Z87891 Personal history of nicotine dependence: Secondary | ICD-10-CM | POA: Diagnosis not present

## 2021-02-27 DIAGNOSIS — Z23 Encounter for immunization: Secondary | ICD-10-CM | POA: Diagnosis not present

## 2021-02-27 DIAGNOSIS — Z6820 Body mass index (BMI) 20.0-20.9, adult: Secondary | ICD-10-CM | POA: Diagnosis not present

## 2021-02-27 DIAGNOSIS — K5010 Crohn's disease of large intestine without complications: Secondary | ICD-10-CM | POA: Diagnosis not present

## 2021-02-27 DIAGNOSIS — K219 Gastro-esophageal reflux disease without esophagitis: Secondary | ICD-10-CM | POA: Diagnosis not present

## 2021-02-27 DIAGNOSIS — Z8546 Personal history of malignant neoplasm of prostate: Secondary | ICD-10-CM | POA: Diagnosis not present

## 2021-02-27 DIAGNOSIS — K5090 Crohn's disease, unspecified, without complications: Secondary | ICD-10-CM | POA: Diagnosis not present

## 2021-02-27 DIAGNOSIS — J9611 Chronic respiratory failure with hypoxia: Secondary | ICD-10-CM | POA: Diagnosis not present

## 2021-03-07 DIAGNOSIS — J439 Emphysema, unspecified: Secondary | ICD-10-CM | POA: Diagnosis not present

## 2021-03-07 DIAGNOSIS — I700 Atherosclerosis of aorta: Secondary | ICD-10-CM | POA: Diagnosis not present

## 2021-03-07 DIAGNOSIS — R911 Solitary pulmonary nodule: Secondary | ICD-10-CM | POA: Diagnosis not present

## 2021-03-07 DIAGNOSIS — I2510 Atherosclerotic heart disease of native coronary artery without angina pectoris: Secondary | ICD-10-CM | POA: Diagnosis not present

## 2021-03-07 DIAGNOSIS — I2584 Coronary atherosclerosis due to calcified coronary lesion: Secondary | ICD-10-CM | POA: Diagnosis not present

## 2021-03-24 DIAGNOSIS — I5021 Acute systolic (congestive) heart failure: Secondary | ICD-10-CM | POA: Diagnosis not present

## 2021-03-24 DIAGNOSIS — Z87891 Personal history of nicotine dependence: Secondary | ICD-10-CM | POA: Diagnosis not present

## 2021-03-24 DIAGNOSIS — I110 Hypertensive heart disease with heart failure: Secondary | ICD-10-CM | POA: Diagnosis not present

## 2021-03-24 DIAGNOSIS — J9611 Chronic respiratory failure with hypoxia: Secondary | ICD-10-CM | POA: Diagnosis not present

## 2021-04-03 DIAGNOSIS — Z20822 Contact with and (suspected) exposure to covid-19: Secondary | ICD-10-CM | POA: Diagnosis not present

## 2021-04-09 DIAGNOSIS — Z87891 Personal history of nicotine dependence: Secondary | ICD-10-CM | POA: Diagnosis not present

## 2021-04-09 DIAGNOSIS — J449 Chronic obstructive pulmonary disease, unspecified: Secondary | ICD-10-CM | POA: Diagnosis not present

## 2021-04-09 DIAGNOSIS — M4854XA Collapsed vertebra, not elsewhere classified, thoracic region, initial encounter for fracture: Secondary | ICD-10-CM | POA: Diagnosis not present

## 2021-04-09 DIAGNOSIS — J9611 Chronic respiratory failure with hypoxia: Secondary | ICD-10-CM | POA: Diagnosis not present

## 2021-04-09 DIAGNOSIS — K5010 Crohn's disease of large intestine without complications: Secondary | ICD-10-CM | POA: Diagnosis not present

## 2021-04-09 DIAGNOSIS — J479 Bronchiectasis, uncomplicated: Secondary | ICD-10-CM | POA: Diagnosis not present

## 2021-04-15 DIAGNOSIS — D649 Anemia, unspecified: Secondary | ICD-10-CM | POA: Diagnosis not present

## 2021-04-15 DIAGNOSIS — Z7902 Long term (current) use of antithrombotics/antiplatelets: Secondary | ICD-10-CM | POA: Diagnosis not present

## 2021-04-15 DIAGNOSIS — J449 Chronic obstructive pulmonary disease, unspecified: Secondary | ICD-10-CM | POA: Diagnosis not present

## 2021-04-15 DIAGNOSIS — Z86711 Personal history of pulmonary embolism: Secondary | ICD-10-CM | POA: Diagnosis not present

## 2021-04-15 DIAGNOSIS — D472 Monoclonal gammopathy: Secondary | ICD-10-CM | POA: Diagnosis not present

## 2021-04-15 DIAGNOSIS — Z8616 Personal history of COVID-19: Secondary | ICD-10-CM | POA: Diagnosis not present

## 2021-04-15 DIAGNOSIS — Z7982 Long term (current) use of aspirin: Secondary | ICD-10-CM | POA: Diagnosis not present

## 2021-04-15 DIAGNOSIS — Z79891 Long term (current) use of opiate analgesic: Secondary | ICD-10-CM | POA: Diagnosis not present

## 2021-04-15 DIAGNOSIS — I10 Essential (primary) hypertension: Secondary | ICD-10-CM | POA: Diagnosis not present

## 2021-04-15 DIAGNOSIS — R771 Abnormality of globulin: Secondary | ICD-10-CM | POA: Diagnosis not present

## 2021-04-15 DIAGNOSIS — K219 Gastro-esophageal reflux disease without esophagitis: Secondary | ICD-10-CM | POA: Diagnosis not present

## 2021-05-02 DIAGNOSIS — D472 Monoclonal gammopathy: Secondary | ICD-10-CM | POA: Diagnosis not present

## 2021-05-23 DIAGNOSIS — I5021 Acute systolic (congestive) heart failure: Secondary | ICD-10-CM | POA: Diagnosis not present

## 2021-05-23 DIAGNOSIS — I110 Hypertensive heart disease with heart failure: Secondary | ICD-10-CM | POA: Diagnosis not present

## 2021-05-23 DIAGNOSIS — Z87891 Personal history of nicotine dependence: Secondary | ICD-10-CM | POA: Diagnosis not present

## 2021-05-23 DIAGNOSIS — J9611 Chronic respiratory failure with hypoxia: Secondary | ICD-10-CM | POA: Diagnosis not present

## 2021-05-23 DIAGNOSIS — J441 Chronic obstructive pulmonary disease with (acute) exacerbation: Secondary | ICD-10-CM | POA: Diagnosis not present

## 2021-06-04 DIAGNOSIS — R911 Solitary pulmonary nodule: Secondary | ICD-10-CM | POA: Diagnosis not present

## 2021-06-04 DIAGNOSIS — K5010 Crohn's disease of large intestine without complications: Secondary | ICD-10-CM | POA: Diagnosis not present

## 2021-06-04 DIAGNOSIS — I700 Atherosclerosis of aorta: Secondary | ICD-10-CM | POA: Diagnosis not present

## 2021-06-04 DIAGNOSIS — R918 Other nonspecific abnormal finding of lung field: Secondary | ICD-10-CM | POA: Diagnosis not present

## 2021-06-04 DIAGNOSIS — J439 Emphysema, unspecified: Secondary | ICD-10-CM | POA: Diagnosis not present

## 2021-06-13 DIAGNOSIS — K5090 Crohn's disease, unspecified, without complications: Secondary | ICD-10-CM | POA: Diagnosis not present

## 2021-06-30 DIAGNOSIS — K5010 Crohn's disease of large intestine without complications: Secondary | ICD-10-CM | POA: Diagnosis not present

## 2021-06-30 DIAGNOSIS — R12 Heartburn: Secondary | ICD-10-CM | POA: Diagnosis not present

## 2021-07-12 DIAGNOSIS — Z20822 Contact with and (suspected) exposure to covid-19: Secondary | ICD-10-CM | POA: Diagnosis not present

## 2021-07-22 DIAGNOSIS — I110 Hypertensive heart disease with heart failure: Secondary | ICD-10-CM | POA: Diagnosis not present

## 2021-07-22 DIAGNOSIS — I5021 Acute systolic (congestive) heart failure: Secondary | ICD-10-CM | POA: Diagnosis not present

## 2021-07-30 DIAGNOSIS — K5010 Crohn's disease of large intestine without complications: Secondary | ICD-10-CM | POA: Diagnosis not present

## 2021-08-02 DIAGNOSIS — Z20822 Contact with and (suspected) exposure to covid-19: Secondary | ICD-10-CM | POA: Diagnosis not present

## 2021-08-05 DIAGNOSIS — Z20822 Contact with and (suspected) exposure to covid-19: Secondary | ICD-10-CM | POA: Diagnosis not present

## 2021-08-06 DIAGNOSIS — Z20822 Contact with and (suspected) exposure to covid-19: Secondary | ICD-10-CM | POA: Diagnosis not present

## 2021-08-09 DIAGNOSIS — Z20822 Contact with and (suspected) exposure to covid-19: Secondary | ICD-10-CM | POA: Diagnosis not present

## 2021-08-10 DIAGNOSIS — Z20828 Contact with and (suspected) exposure to other viral communicable diseases: Secondary | ICD-10-CM | POA: Diagnosis not present

## 2021-08-22 DIAGNOSIS — Z20822 Contact with and (suspected) exposure to covid-19: Secondary | ICD-10-CM | POA: Diagnosis not present

## 2021-09-02 DIAGNOSIS — J440 Chronic obstructive pulmonary disease with acute lower respiratory infection: Secondary | ICD-10-CM | POA: Diagnosis not present

## 2021-09-02 DIAGNOSIS — E7849 Other hyperlipidemia: Secondary | ICD-10-CM | POA: Diagnosis not present

## 2021-09-02 DIAGNOSIS — E782 Mixed hyperlipidemia: Secondary | ICD-10-CM | POA: Diagnosis not present

## 2021-09-02 DIAGNOSIS — K219 Gastro-esophageal reflux disease without esophagitis: Secondary | ICD-10-CM | POA: Diagnosis not present

## 2021-09-02 DIAGNOSIS — Z20822 Contact with and (suspected) exposure to covid-19: Secondary | ICD-10-CM | POA: Diagnosis not present

## 2021-09-02 DIAGNOSIS — K5090 Crohn's disease, unspecified, without complications: Secondary | ICD-10-CM | POA: Diagnosis not present

## 2021-09-02 DIAGNOSIS — E559 Vitamin D deficiency, unspecified: Secondary | ICD-10-CM | POA: Diagnosis not present

## 2021-09-02 DIAGNOSIS — Z1329 Encounter for screening for other suspected endocrine disorder: Secondary | ICD-10-CM | POA: Diagnosis not present

## 2021-09-02 DIAGNOSIS — I10 Essential (primary) hypertension: Secondary | ICD-10-CM | POA: Diagnosis not present

## 2021-09-03 DIAGNOSIS — Z20822 Contact with and (suspected) exposure to covid-19: Secondary | ICD-10-CM | POA: Diagnosis not present

## 2021-09-04 DIAGNOSIS — Z20822 Contact with and (suspected) exposure to covid-19: Secondary | ICD-10-CM | POA: Diagnosis not present

## 2021-09-05 DIAGNOSIS — I700 Atherosclerosis of aorta: Secondary | ICD-10-CM | POA: Diagnosis not present

## 2021-09-05 DIAGNOSIS — R911 Solitary pulmonary nodule: Secondary | ICD-10-CM | POA: Diagnosis not present

## 2021-09-05 DIAGNOSIS — J439 Emphysema, unspecified: Secondary | ICD-10-CM | POA: Diagnosis not present

## 2021-09-05 DIAGNOSIS — S22060A Wedge compression fracture of T7-T8 vertebra, initial encounter for closed fracture: Secondary | ICD-10-CM | POA: Diagnosis not present

## 2021-09-05 DIAGNOSIS — Z0001 Encounter for general adult medical examination with abnormal findings: Secondary | ICD-10-CM | POA: Diagnosis not present

## 2021-09-05 DIAGNOSIS — J9611 Chronic respiratory failure with hypoxia: Secondary | ICD-10-CM | POA: Diagnosis not present

## 2021-09-05 DIAGNOSIS — Z23 Encounter for immunization: Secondary | ICD-10-CM | POA: Diagnosis not present

## 2021-09-05 DIAGNOSIS — I10 Essential (primary) hypertension: Secondary | ICD-10-CM | POA: Diagnosis not present

## 2021-09-06 DIAGNOSIS — Z20828 Contact with and (suspected) exposure to other viral communicable diseases: Secondary | ICD-10-CM | POA: Diagnosis not present

## 2021-09-08 DIAGNOSIS — Z20822 Contact with and (suspected) exposure to covid-19: Secondary | ICD-10-CM | POA: Diagnosis not present

## 2021-09-15 DIAGNOSIS — Z20828 Contact with and (suspected) exposure to other viral communicable diseases: Secondary | ICD-10-CM | POA: Diagnosis not present

## 2021-09-18 DIAGNOSIS — Z20822 Contact with and (suspected) exposure to covid-19: Secondary | ICD-10-CM | POA: Diagnosis not present

## 2021-09-22 DIAGNOSIS — Z20822 Contact with and (suspected) exposure to covid-19: Secondary | ICD-10-CM | POA: Diagnosis not present

## 2021-09-24 DIAGNOSIS — K5010 Crohn's disease of large intestine without complications: Secondary | ICD-10-CM | POA: Diagnosis not present

## 2021-09-25 DIAGNOSIS — Z20822 Contact with and (suspected) exposure to covid-19: Secondary | ICD-10-CM | POA: Diagnosis not present

## 2021-09-28 DIAGNOSIS — Z20822 Contact with and (suspected) exposure to covid-19: Secondary | ICD-10-CM | POA: Diagnosis not present

## 2021-10-01 DIAGNOSIS — Z0184 Encounter for antibody response examination: Secondary | ICD-10-CM | POA: Diagnosis not present

## 2021-10-02 DIAGNOSIS — Z20822 Contact with and (suspected) exposure to covid-19: Secondary | ICD-10-CM | POA: Diagnosis not present

## 2021-10-17 DIAGNOSIS — D472 Monoclonal gammopathy: Secondary | ICD-10-CM | POA: Diagnosis not present

## 2021-10-17 DIAGNOSIS — Z8616 Personal history of COVID-19: Secondary | ICD-10-CM | POA: Diagnosis not present

## 2021-10-17 DIAGNOSIS — D649 Anemia, unspecified: Secondary | ICD-10-CM | POA: Diagnosis not present

## 2021-10-17 DIAGNOSIS — Z79899 Other long term (current) drug therapy: Secondary | ICD-10-CM | POA: Diagnosis not present

## 2021-10-17 DIAGNOSIS — Z86711 Personal history of pulmonary embolism: Secondary | ICD-10-CM | POA: Diagnosis not present

## 2021-10-17 DIAGNOSIS — Z809 Family history of malignant neoplasm, unspecified: Secondary | ICD-10-CM | POA: Diagnosis not present

## 2021-10-17 DIAGNOSIS — I10 Essential (primary) hypertension: Secondary | ICD-10-CM | POA: Diagnosis not present

## 2021-10-17 DIAGNOSIS — J449 Chronic obstructive pulmonary disease, unspecified: Secondary | ICD-10-CM | POA: Diagnosis not present

## 2021-10-17 DIAGNOSIS — Z87891 Personal history of nicotine dependence: Secondary | ICD-10-CM | POA: Diagnosis not present

## 2021-10-17 DIAGNOSIS — R771 Abnormality of globulin: Secondary | ICD-10-CM | POA: Diagnosis not present

## 2021-10-17 DIAGNOSIS — Z801 Family history of malignant neoplasm of trachea, bronchus and lung: Secondary | ICD-10-CM | POA: Diagnosis not present

## 2021-10-17 DIAGNOSIS — K219 Gastro-esophageal reflux disease without esophagitis: Secondary | ICD-10-CM | POA: Diagnosis not present

## 2021-10-17 DIAGNOSIS — Z7982 Long term (current) use of aspirin: Secondary | ICD-10-CM | POA: Diagnosis not present

## 2021-10-17 DIAGNOSIS — Z823 Family history of stroke: Secondary | ICD-10-CM | POA: Diagnosis not present

## 2021-10-17 DIAGNOSIS — H538 Other visual disturbances: Secondary | ICD-10-CM | POA: Diagnosis not present

## 2021-10-31 DIAGNOSIS — R771 Abnormality of globulin: Secondary | ICD-10-CM | POA: Diagnosis not present

## 2021-11-10 DIAGNOSIS — C44622 Squamous cell carcinoma of skin of right upper limb, including shoulder: Secondary | ICD-10-CM | POA: Diagnosis not present

## 2021-11-10 DIAGNOSIS — L853 Xerosis cutis: Secondary | ICD-10-CM | POA: Diagnosis not present

## 2021-11-10 DIAGNOSIS — R233 Spontaneous ecchymoses: Secondary | ICD-10-CM | POA: Diagnosis not present

## 2021-11-10 DIAGNOSIS — D2239 Melanocytic nevi of other parts of face: Secondary | ICD-10-CM | POA: Diagnosis not present

## 2021-11-10 DIAGNOSIS — D224 Melanocytic nevi of scalp and neck: Secondary | ICD-10-CM | POA: Diagnosis not present

## 2021-11-10 DIAGNOSIS — D225 Melanocytic nevi of trunk: Secondary | ICD-10-CM | POA: Diagnosis not present

## 2021-11-10 DIAGNOSIS — K5010 Crohn's disease of large intestine without complications: Secondary | ICD-10-CM | POA: Diagnosis not present

## 2021-11-10 DIAGNOSIS — R159 Full incontinence of feces: Secondary | ICD-10-CM | POA: Diagnosis not present

## 2021-11-10 DIAGNOSIS — L821 Other seborrheic keratosis: Secondary | ICD-10-CM | POA: Diagnosis not present

## 2021-11-10 DIAGNOSIS — Z08 Encounter for follow-up examination after completed treatment for malignant neoplasm: Secondary | ICD-10-CM | POA: Diagnosis not present

## 2021-11-10 DIAGNOSIS — Z85828 Personal history of other malignant neoplasm of skin: Secondary | ICD-10-CM | POA: Diagnosis not present

## 2021-11-19 DIAGNOSIS — K5010 Crohn's disease of large intestine without complications: Secondary | ICD-10-CM | POA: Diagnosis not present

## 2021-11-21 DIAGNOSIS — I5021 Acute systolic (congestive) heart failure: Secondary | ICD-10-CM | POA: Diagnosis not present

## 2021-11-21 DIAGNOSIS — J441 Chronic obstructive pulmonary disease with (acute) exacerbation: Secondary | ICD-10-CM | POA: Diagnosis not present

## 2021-11-21 DIAGNOSIS — J9611 Chronic respiratory failure with hypoxia: Secondary | ICD-10-CM | POA: Diagnosis not present

## 2021-12-12 DIAGNOSIS — J9611 Chronic respiratory failure with hypoxia: Secondary | ICD-10-CM | POA: Diagnosis not present

## 2021-12-12 DIAGNOSIS — M4854XA Collapsed vertebra, not elsewhere classified, thoracic region, initial encounter for fracture: Secondary | ICD-10-CM | POA: Diagnosis not present

## 2021-12-12 DIAGNOSIS — J479 Bronchiectasis, uncomplicated: Secondary | ICD-10-CM | POA: Diagnosis not present

## 2021-12-12 DIAGNOSIS — J449 Chronic obstructive pulmonary disease, unspecified: Secondary | ICD-10-CM | POA: Diagnosis not present

## 2021-12-12 DIAGNOSIS — Z87891 Personal history of nicotine dependence: Secondary | ICD-10-CM | POA: Diagnosis not present

## 2022-01-09 DIAGNOSIS — N138 Other obstructive and reflux uropathy: Secondary | ICD-10-CM | POA: Diagnosis not present

## 2022-01-09 DIAGNOSIS — N401 Enlarged prostate with lower urinary tract symptoms: Secondary | ICD-10-CM | POA: Diagnosis not present

## 2022-01-09 DIAGNOSIS — Z8546 Personal history of malignant neoplasm of prostate: Secondary | ICD-10-CM | POA: Diagnosis not present

## 2022-01-12 DIAGNOSIS — R197 Diarrhea, unspecified: Secondary | ICD-10-CM | POA: Diagnosis not present

## 2022-01-12 DIAGNOSIS — R12 Heartburn: Secondary | ICD-10-CM | POA: Diagnosis not present

## 2022-01-12 DIAGNOSIS — K5010 Crohn's disease of large intestine without complications: Secondary | ICD-10-CM | POA: Diagnosis not present

## 2022-01-14 DIAGNOSIS — R197 Diarrhea, unspecified: Secondary | ICD-10-CM | POA: Diagnosis not present

## 2022-01-14 DIAGNOSIS — K5010 Crohn's disease of large intestine without complications: Secondary | ICD-10-CM | POA: Diagnosis not present

## 2022-01-22 DIAGNOSIS — E782 Mixed hyperlipidemia: Secondary | ICD-10-CM | POA: Diagnosis not present

## 2022-01-22 DIAGNOSIS — J441 Chronic obstructive pulmonary disease with (acute) exacerbation: Secondary | ICD-10-CM | POA: Diagnosis not present

## 2022-01-22 DIAGNOSIS — K219 Gastro-esophageal reflux disease without esophagitis: Secondary | ICD-10-CM | POA: Diagnosis not present

## 2022-02-08 DIAGNOSIS — Z8546 Personal history of malignant neoplasm of prostate: Secondary | ICD-10-CM | POA: Diagnosis not present

## 2022-02-08 DIAGNOSIS — Z923 Personal history of irradiation: Secondary | ICD-10-CM | POA: Diagnosis not present

## 2022-02-08 DIAGNOSIS — Z20822 Contact with and (suspected) exposure to covid-19: Secondary | ICD-10-CM | POA: Diagnosis present

## 2022-02-08 DIAGNOSIS — Z87891 Personal history of nicotine dependence: Secondary | ICD-10-CM | POA: Diagnosis not present

## 2022-02-08 DIAGNOSIS — J440 Chronic obstructive pulmonary disease with acute lower respiratory infection: Secondary | ICD-10-CM | POA: Diagnosis present

## 2022-02-08 DIAGNOSIS — Z7982 Long term (current) use of aspirin: Secondary | ICD-10-CM | POA: Diagnosis not present

## 2022-02-08 DIAGNOSIS — Z882 Allergy status to sulfonamides status: Secondary | ICD-10-CM | POA: Diagnosis not present

## 2022-02-08 DIAGNOSIS — J441 Chronic obstructive pulmonary disease with (acute) exacerbation: Secondary | ICD-10-CM | POA: Diagnosis present

## 2022-02-08 DIAGNOSIS — R942 Abnormal results of pulmonary function studies: Secondary | ICD-10-CM | POA: Diagnosis not present

## 2022-02-08 DIAGNOSIS — J189 Pneumonia, unspecified organism: Secondary | ICD-10-CM | POA: Diagnosis present

## 2022-02-08 DIAGNOSIS — I10 Essential (primary) hypertension: Secondary | ICD-10-CM | POA: Diagnosis not present

## 2022-02-08 DIAGNOSIS — Z79899 Other long term (current) drug therapy: Secondary | ICD-10-CM | POA: Diagnosis not present

## 2022-02-08 DIAGNOSIS — E785 Hyperlipidemia, unspecified: Secondary | ICD-10-CM | POA: Diagnosis present

## 2022-02-08 DIAGNOSIS — R0902 Hypoxemia: Secondary | ICD-10-CM | POA: Diagnosis not present

## 2022-02-08 DIAGNOSIS — R0602 Shortness of breath: Secondary | ICD-10-CM | POA: Diagnosis not present

## 2022-02-08 DIAGNOSIS — Z86711 Personal history of pulmonary embolism: Secondary | ICD-10-CM | POA: Diagnosis not present

## 2022-02-08 DIAGNOSIS — E860 Dehydration: Secondary | ICD-10-CM | POA: Diagnosis present

## 2022-02-08 DIAGNOSIS — D509 Iron deficiency anemia, unspecified: Secondary | ICD-10-CM | POA: Diagnosis present

## 2022-02-08 DIAGNOSIS — N400 Enlarged prostate without lower urinary tract symptoms: Secondary | ICD-10-CM | POA: Diagnosis present

## 2022-02-08 DIAGNOSIS — R069 Unspecified abnormalities of breathing: Secondary | ICD-10-CM | POA: Diagnosis not present

## 2022-02-08 DIAGNOSIS — J9601 Acute respiratory failure with hypoxia: Secondary | ICD-10-CM | POA: Diagnosis present

## 2022-02-08 DIAGNOSIS — R000 Tachycardia, unspecified: Secondary | ICD-10-CM | POA: Diagnosis not present

## 2022-02-08 DIAGNOSIS — R0689 Other abnormalities of breathing: Secondary | ICD-10-CM | POA: Diagnosis not present

## 2022-02-08 DIAGNOSIS — K219 Gastro-esophageal reflux disease without esophagitis: Secondary | ICD-10-CM | POA: Diagnosis present

## 2022-02-08 DIAGNOSIS — K5090 Crohn's disease, unspecified, without complications: Secondary | ICD-10-CM | POA: Diagnosis present

## 2022-02-08 DIAGNOSIS — F419 Anxiety disorder, unspecified: Secondary | ICD-10-CM | POA: Diagnosis present

## 2022-02-08 DIAGNOSIS — Z0289 Encounter for other administrative examinations: Secondary | ICD-10-CM | POA: Diagnosis not present

## 2022-02-08 DIAGNOSIS — R079 Chest pain, unspecified: Secondary | ICD-10-CM | POA: Diagnosis not present

## 2022-02-09 DIAGNOSIS — Z79899 Other long term (current) drug therapy: Secondary | ICD-10-CM | POA: Diagnosis not present

## 2022-02-09 DIAGNOSIS — Z923 Personal history of irradiation: Secondary | ICD-10-CM | POA: Diagnosis not present

## 2022-02-09 DIAGNOSIS — Z7982 Long term (current) use of aspirin: Secondary | ICD-10-CM | POA: Diagnosis not present

## 2022-02-09 DIAGNOSIS — J9601 Acute respiratory failure with hypoxia: Secondary | ICD-10-CM | POA: Diagnosis present

## 2022-02-09 DIAGNOSIS — E785 Hyperlipidemia, unspecified: Secondary | ICD-10-CM | POA: Diagnosis present

## 2022-02-09 DIAGNOSIS — Z86711 Personal history of pulmonary embolism: Secondary | ICD-10-CM | POA: Diagnosis not present

## 2022-02-09 DIAGNOSIS — J189 Pneumonia, unspecified organism: Secondary | ICD-10-CM | POA: Diagnosis present

## 2022-02-09 DIAGNOSIS — J440 Chronic obstructive pulmonary disease with acute lower respiratory infection: Secondary | ICD-10-CM | POA: Diagnosis present

## 2022-02-09 DIAGNOSIS — K219 Gastro-esophageal reflux disease without esophagitis: Secondary | ICD-10-CM | POA: Diagnosis present

## 2022-02-09 DIAGNOSIS — J441 Chronic obstructive pulmonary disease with (acute) exacerbation: Secondary | ICD-10-CM | POA: Diagnosis present

## 2022-02-09 DIAGNOSIS — Z882 Allergy status to sulfonamides status: Secondary | ICD-10-CM | POA: Diagnosis not present

## 2022-02-09 DIAGNOSIS — F419 Anxiety disorder, unspecified: Secondary | ICD-10-CM | POA: Diagnosis present

## 2022-02-09 DIAGNOSIS — D509 Iron deficiency anemia, unspecified: Secondary | ICD-10-CM | POA: Diagnosis present

## 2022-02-09 DIAGNOSIS — E860 Dehydration: Secondary | ICD-10-CM | POA: Diagnosis present

## 2022-02-09 DIAGNOSIS — Z8546 Personal history of malignant neoplasm of prostate: Secondary | ICD-10-CM | POA: Diagnosis not present

## 2022-02-09 DIAGNOSIS — I10 Essential (primary) hypertension: Secondary | ICD-10-CM | POA: Diagnosis present

## 2022-02-09 DIAGNOSIS — Z87891 Personal history of nicotine dependence: Secondary | ICD-10-CM | POA: Diagnosis not present

## 2022-02-09 DIAGNOSIS — K5090 Crohn's disease, unspecified, without complications: Secondary | ICD-10-CM | POA: Diagnosis present

## 2022-02-09 DIAGNOSIS — N400 Enlarged prostate without lower urinary tract symptoms: Secondary | ICD-10-CM | POA: Diagnosis present

## 2022-02-09 DIAGNOSIS — Z20822 Contact with and (suspected) exposure to covid-19: Secondary | ICD-10-CM | POA: Diagnosis present

## 2022-03-02 DIAGNOSIS — D649 Anemia, unspecified: Secondary | ICD-10-CM | POA: Diagnosis not present

## 2022-03-02 DIAGNOSIS — J9611 Chronic respiratory failure with hypoxia: Secondary | ICD-10-CM | POA: Diagnosis not present

## 2022-03-02 DIAGNOSIS — R5382 Chronic fatigue, unspecified: Secondary | ICD-10-CM | POA: Diagnosis not present

## 2022-03-02 DIAGNOSIS — K219 Gastro-esophageal reflux disease without esophagitis: Secondary | ICD-10-CM | POA: Diagnosis not present

## 2022-03-02 DIAGNOSIS — E7849 Other hyperlipidemia: Secondary | ICD-10-CM | POA: Diagnosis not present

## 2022-03-02 DIAGNOSIS — J479 Bronchiectasis, uncomplicated: Secondary | ICD-10-CM | POA: Diagnosis not present

## 2022-03-02 DIAGNOSIS — E782 Mixed hyperlipidemia: Secondary | ICD-10-CM | POA: Diagnosis not present

## 2022-03-02 DIAGNOSIS — I10 Essential (primary) hypertension: Secondary | ICD-10-CM | POA: Diagnosis not present

## 2022-03-02 DIAGNOSIS — J441 Chronic obstructive pulmonary disease with (acute) exacerbation: Secondary | ICD-10-CM | POA: Diagnosis not present

## 2022-03-02 DIAGNOSIS — J449 Chronic obstructive pulmonary disease, unspecified: Secondary | ICD-10-CM | POA: Diagnosis not present

## 2022-03-02 DIAGNOSIS — M4854XA Collapsed vertebra, not elsewhere classified, thoracic region, initial encounter for fracture: Secondary | ICD-10-CM | POA: Diagnosis not present

## 2022-03-02 DIAGNOSIS — Z87891 Personal history of nicotine dependence: Secondary | ICD-10-CM | POA: Diagnosis not present

## 2022-03-06 DIAGNOSIS — R030 Elevated blood-pressure reading, without diagnosis of hypertension: Secondary | ICD-10-CM | POA: Diagnosis not present

## 2022-03-06 DIAGNOSIS — Z87891 Personal history of nicotine dependence: Secondary | ICD-10-CM | POA: Diagnosis not present

## 2022-03-06 DIAGNOSIS — R911 Solitary pulmonary nodule: Secondary | ICD-10-CM | POA: Diagnosis not present

## 2022-03-06 DIAGNOSIS — J441 Chronic obstructive pulmonary disease with (acute) exacerbation: Secondary | ICD-10-CM | POA: Diagnosis not present

## 2022-03-06 DIAGNOSIS — I700 Atherosclerosis of aorta: Secondary | ICD-10-CM | POA: Diagnosis not present

## 2022-03-06 DIAGNOSIS — K5090 Crohn's disease, unspecified, without complications: Secondary | ICD-10-CM | POA: Diagnosis not present

## 2022-03-06 DIAGNOSIS — Z23 Encounter for immunization: Secondary | ICD-10-CM | POA: Diagnosis not present

## 2022-03-06 DIAGNOSIS — J9601 Acute respiratory failure with hypoxia: Secondary | ICD-10-CM | POA: Diagnosis not present

## 2022-03-06 DIAGNOSIS — S22060A Wedge compression fracture of T7-T8 vertebra, initial encounter for closed fracture: Secondary | ICD-10-CM | POA: Diagnosis not present

## 2022-03-06 DIAGNOSIS — M8580 Other specified disorders of bone density and structure, unspecified site: Secondary | ICD-10-CM | POA: Diagnosis not present

## 2022-03-06 DIAGNOSIS — D509 Iron deficiency anemia, unspecified: Secondary | ICD-10-CM | POA: Diagnosis not present

## 2022-03-06 DIAGNOSIS — Z8546 Personal history of malignant neoplasm of prostate: Secondary | ICD-10-CM | POA: Diagnosis not present

## 2022-03-23 DIAGNOSIS — K5010 Crohn's disease of large intestine without complications: Secondary | ICD-10-CM | POA: Diagnosis not present

## 2022-03-23 DIAGNOSIS — R159 Full incontinence of feces: Secondary | ICD-10-CM | POA: Diagnosis not present

## 2022-03-23 DIAGNOSIS — R12 Heartburn: Secondary | ICD-10-CM | POA: Diagnosis not present

## 2022-03-23 DIAGNOSIS — R143 Flatulence: Secondary | ICD-10-CM | POA: Diagnosis not present

## 2022-04-23 DIAGNOSIS — J441 Chronic obstructive pulmonary disease with (acute) exacerbation: Secondary | ICD-10-CM | POA: Diagnosis not present

## 2022-04-23 DIAGNOSIS — E782 Mixed hyperlipidemia: Secondary | ICD-10-CM | POA: Diagnosis not present

## 2022-04-23 DIAGNOSIS — K219 Gastro-esophageal reflux disease without esophagitis: Secondary | ICD-10-CM | POA: Diagnosis not present

## 2022-05-01 DIAGNOSIS — R918 Other nonspecific abnormal finding of lung field: Secondary | ICD-10-CM | POA: Diagnosis not present

## 2022-05-01 DIAGNOSIS — D696 Thrombocytopenia, unspecified: Secondary | ICD-10-CM | POA: Diagnosis not present

## 2022-05-01 DIAGNOSIS — D472 Monoclonal gammopathy: Secondary | ICD-10-CM | POA: Diagnosis not present

## 2022-05-01 DIAGNOSIS — Z79899 Other long term (current) drug therapy: Secondary | ICD-10-CM | POA: Diagnosis not present

## 2022-05-01 DIAGNOSIS — Z87891 Personal history of nicotine dependence: Secondary | ICD-10-CM | POA: Diagnosis not present

## 2022-05-01 DIAGNOSIS — K5010 Crohn's disease of large intestine without complications: Secondary | ICD-10-CM | POA: Diagnosis not present

## 2022-05-06 DIAGNOSIS — R918 Other nonspecific abnormal finding of lung field: Secondary | ICD-10-CM | POA: Diagnosis not present

## 2022-05-15 DIAGNOSIS — R771 Abnormality of globulin: Secondary | ICD-10-CM | POA: Diagnosis not present

## 2022-06-12 DIAGNOSIS — J9611 Chronic respiratory failure with hypoxia: Secondary | ICD-10-CM | POA: Diagnosis not present

## 2022-06-12 DIAGNOSIS — J449 Chronic obstructive pulmonary disease, unspecified: Secondary | ICD-10-CM | POA: Diagnosis not present

## 2022-06-12 DIAGNOSIS — M4854XA Collapsed vertebra, not elsewhere classified, thoracic region, initial encounter for fracture: Secondary | ICD-10-CM | POA: Diagnosis not present

## 2022-06-12 DIAGNOSIS — J479 Bronchiectasis, uncomplicated: Secondary | ICD-10-CM | POA: Diagnosis not present

## 2022-06-12 DIAGNOSIS — Z87891 Personal history of nicotine dependence: Secondary | ICD-10-CM | POA: Diagnosis not present

## 2022-06-12 DIAGNOSIS — R918 Other nonspecific abnormal finding of lung field: Secondary | ICD-10-CM | POA: Diagnosis not present

## 2022-06-25 DIAGNOSIS — R159 Full incontinence of feces: Secondary | ICD-10-CM | POA: Diagnosis not present

## 2022-06-25 DIAGNOSIS — K5010 Crohn's disease of large intestine without complications: Secondary | ICD-10-CM | POA: Diagnosis not present

## 2022-07-23 DIAGNOSIS — I5021 Acute systolic (congestive) heart failure: Secondary | ICD-10-CM | POA: Diagnosis not present

## 2022-07-23 DIAGNOSIS — E782 Mixed hyperlipidemia: Secondary | ICD-10-CM | POA: Diagnosis not present

## 2022-07-23 DIAGNOSIS — J441 Chronic obstructive pulmonary disease with (acute) exacerbation: Secondary | ICD-10-CM | POA: Diagnosis not present

## 2022-08-17 DIAGNOSIS — C44222 Squamous cell carcinoma of skin of right ear and external auricular canal: Secondary | ICD-10-CM | POA: Diagnosis not present

## 2022-08-17 DIAGNOSIS — D485 Neoplasm of uncertain behavior of skin: Secondary | ICD-10-CM | POA: Diagnosis not present

## 2022-08-28 DIAGNOSIS — Z86711 Personal history of pulmonary embolism: Secondary | ICD-10-CM | POA: Diagnosis not present

## 2022-08-28 DIAGNOSIS — Z4682 Encounter for fitting and adjustment of non-vascular catheter: Secondary | ICD-10-CM | POA: Diagnosis not present

## 2022-08-28 DIAGNOSIS — Z79899 Other long term (current) drug therapy: Secondary | ICD-10-CM | POA: Diagnosis not present

## 2022-08-28 DIAGNOSIS — I10 Essential (primary) hypertension: Secondary | ICD-10-CM | POA: Diagnosis not present

## 2022-08-28 DIAGNOSIS — Z8679 Personal history of other diseases of the circulatory system: Secondary | ICD-10-CM | POA: Diagnosis not present

## 2022-08-28 DIAGNOSIS — K6389 Other specified diseases of intestine: Secondary | ICD-10-CM | POA: Diagnosis not present

## 2022-08-28 DIAGNOSIS — R339 Retention of urine, unspecified: Secondary | ICD-10-CM | POA: Diagnosis not present

## 2022-08-28 DIAGNOSIS — Z7982 Long term (current) use of aspirin: Secondary | ICD-10-CM | POA: Diagnosis not present

## 2022-08-28 DIAGNOSIS — R0989 Other specified symptoms and signs involving the circulatory and respiratory systems: Secondary | ICD-10-CM | POA: Diagnosis not present

## 2022-08-28 DIAGNOSIS — Z1152 Encounter for screening for COVID-19: Secondary | ICD-10-CM | POA: Diagnosis not present

## 2022-08-28 DIAGNOSIS — E785 Hyperlipidemia, unspecified: Secondary | ICD-10-CM | POA: Diagnosis present

## 2022-08-28 DIAGNOSIS — K5090 Crohn's disease, unspecified, without complications: Secondary | ICD-10-CM | POA: Diagnosis not present

## 2022-08-28 DIAGNOSIS — Z882 Allergy status to sulfonamides status: Secondary | ICD-10-CM | POA: Diagnosis not present

## 2022-08-28 DIAGNOSIS — Z923 Personal history of irradiation: Secondary | ICD-10-CM | POA: Diagnosis not present

## 2022-08-28 DIAGNOSIS — K56609 Unspecified intestinal obstruction, unspecified as to partial versus complete obstruction: Secondary | ICD-10-CM | POA: Diagnosis not present

## 2022-08-28 DIAGNOSIS — K56699 Other intestinal obstruction unspecified as to partial versus complete obstruction: Secondary | ICD-10-CM | POA: Diagnosis not present

## 2022-08-28 DIAGNOSIS — R112 Nausea with vomiting, unspecified: Secondary | ICD-10-CM | POA: Diagnosis not present

## 2022-08-28 DIAGNOSIS — J449 Chronic obstructive pulmonary disease, unspecified: Secondary | ICD-10-CM | POA: Diagnosis not present

## 2022-08-28 DIAGNOSIS — K219 Gastro-esophageal reflux disease without esophagitis: Secondary | ICD-10-CM | POA: Diagnosis present

## 2022-08-28 DIAGNOSIS — N400 Enlarged prostate without lower urinary tract symptoms: Secondary | ICD-10-CM | POA: Diagnosis present

## 2022-08-28 DIAGNOSIS — Z8546 Personal history of malignant neoplasm of prostate: Secondary | ICD-10-CM | POA: Diagnosis not present

## 2022-08-28 DIAGNOSIS — Z87891 Personal history of nicotine dependence: Secondary | ICD-10-CM | POA: Diagnosis not present

## 2022-08-28 DIAGNOSIS — K4090 Unilateral inguinal hernia, without obstruction or gangrene, not specified as recurrent: Secondary | ICD-10-CM | POA: Diagnosis not present

## 2022-08-28 DIAGNOSIS — E876 Hypokalemia: Secondary | ICD-10-CM | POA: Diagnosis not present

## 2022-08-28 DIAGNOSIS — Z79620 Long term (current) use of immunosuppressive biologic: Secondary | ICD-10-CM | POA: Diagnosis not present

## 2022-08-28 DIAGNOSIS — K5651 Intestinal adhesions [bands], with partial obstruction: Secondary | ICD-10-CM | POA: Diagnosis present

## 2022-08-28 DIAGNOSIS — K50012 Crohn's disease of small intestine with intestinal obstruction: Secondary | ICD-10-CM | POA: Diagnosis present

## 2022-08-28 DIAGNOSIS — K562 Volvulus: Secondary | ICD-10-CM | POA: Diagnosis present

## 2022-08-28 DIAGNOSIS — K4030 Unilateral inguinal hernia, with obstruction, without gangrene, not specified as recurrent: Secondary | ICD-10-CM | POA: Diagnosis not present

## 2022-08-28 DIAGNOSIS — F32A Depression, unspecified: Secondary | ICD-10-CM | POA: Diagnosis not present

## 2022-08-28 DIAGNOSIS — K523 Indeterminate colitis: Secondary | ICD-10-CM | POA: Diagnosis not present

## 2022-08-28 DIAGNOSIS — Z20822 Contact with and (suspected) exposure to covid-19: Secondary | ICD-10-CM | POA: Diagnosis not present

## 2022-08-28 DIAGNOSIS — R531 Weakness: Secondary | ICD-10-CM | POA: Diagnosis not present

## 2022-08-28 DIAGNOSIS — R933 Abnormal findings on diagnostic imaging of other parts of digestive tract: Secondary | ICD-10-CM | POA: Diagnosis not present

## 2022-08-28 DIAGNOSIS — K8020 Calculus of gallbladder without cholecystitis without obstruction: Secondary | ICD-10-CM | POA: Diagnosis not present

## 2022-09-05 DIAGNOSIS — N5089 Other specified disorders of the male genital organs: Secondary | ICD-10-CM | POA: Diagnosis not present

## 2022-09-08 DIAGNOSIS — N401 Enlarged prostate with lower urinary tract symptoms: Secondary | ICD-10-CM | POA: Diagnosis not present

## 2022-09-08 DIAGNOSIS — Z8546 Personal history of malignant neoplasm of prostate: Secondary | ICD-10-CM | POA: Diagnosis not present

## 2022-09-08 DIAGNOSIS — R338 Other retention of urine: Secondary | ICD-10-CM | POA: Diagnosis not present

## 2022-09-14 DIAGNOSIS — K5090 Crohn's disease, unspecified, without complications: Secondary | ICD-10-CM | POA: Diagnosis not present

## 2022-09-14 DIAGNOSIS — R911 Solitary pulmonary nodule: Secondary | ICD-10-CM | POA: Diagnosis not present

## 2022-09-14 DIAGNOSIS — E559 Vitamin D deficiency, unspecified: Secondary | ICD-10-CM | POA: Diagnosis not present

## 2022-09-14 DIAGNOSIS — I2699 Other pulmonary embolism without acute cor pulmonale: Secondary | ICD-10-CM | POA: Diagnosis not present

## 2022-09-14 DIAGNOSIS — C61 Malignant neoplasm of prostate: Secondary | ICD-10-CM | POA: Diagnosis not present

## 2022-09-14 DIAGNOSIS — E039 Hypothyroidism, unspecified: Secondary | ICD-10-CM | POA: Diagnosis not present

## 2022-09-14 DIAGNOSIS — Z0001 Encounter for general adult medical examination with abnormal findings: Secondary | ICD-10-CM | POA: Diagnosis not present

## 2022-09-14 DIAGNOSIS — F17211 Nicotine dependence, cigarettes, in remission: Secondary | ICD-10-CM | POA: Diagnosis not present

## 2022-09-18 DIAGNOSIS — R918 Other nonspecific abnormal finding of lung field: Secondary | ICD-10-CM | POA: Diagnosis not present

## 2022-09-22 DIAGNOSIS — R338 Other retention of urine: Secondary | ICD-10-CM | POA: Diagnosis not present

## 2022-09-22 DIAGNOSIS — N390 Urinary tract infection, site not specified: Secondary | ICD-10-CM | POA: Diagnosis not present

## 2022-09-22 DIAGNOSIS — Z8546 Personal history of malignant neoplasm of prostate: Secondary | ICD-10-CM | POA: Diagnosis not present

## 2022-09-22 DIAGNOSIS — R300 Dysuria: Secondary | ICD-10-CM | POA: Diagnosis not present

## 2022-09-22 DIAGNOSIS — N401 Enlarged prostate with lower urinary tract symptoms: Secondary | ICD-10-CM | POA: Diagnosis not present

## 2022-09-23 DIAGNOSIS — R918 Other nonspecific abnormal finding of lung field: Secondary | ICD-10-CM | POA: Diagnosis not present

## 2022-09-28 DIAGNOSIS — N401 Enlarged prostate with lower urinary tract symptoms: Secondary | ICD-10-CM | POA: Diagnosis not present

## 2022-09-28 DIAGNOSIS — Z8546 Personal history of malignant neoplasm of prostate: Secondary | ICD-10-CM | POA: Diagnosis not present

## 2022-09-28 DIAGNOSIS — R338 Other retention of urine: Secondary | ICD-10-CM | POA: Diagnosis not present

## 2022-09-28 DIAGNOSIS — K2100 Gastro-esophageal reflux disease with esophagitis, without bleeding: Secondary | ICD-10-CM | POA: Diagnosis not present

## 2022-09-28 DIAGNOSIS — R197 Diarrhea, unspecified: Secondary | ICD-10-CM | POA: Diagnosis not present

## 2022-09-28 DIAGNOSIS — K5010 Crohn's disease of large intestine without complications: Secondary | ICD-10-CM | POA: Diagnosis not present

## 2022-09-28 DIAGNOSIS — R300 Dysuria: Secondary | ICD-10-CM | POA: Diagnosis not present

## 2022-10-04 DIAGNOSIS — K5090 Crohn's disease, unspecified, without complications: Secondary | ICD-10-CM | POA: Diagnosis not present

## 2022-10-04 DIAGNOSIS — Z8546 Personal history of malignant neoplasm of prostate: Secondary | ICD-10-CM | POA: Diagnosis not present

## 2022-10-04 DIAGNOSIS — I5032 Chronic diastolic (congestive) heart failure: Secondary | ICD-10-CM | POA: Diagnosis not present

## 2022-10-04 DIAGNOSIS — D509 Iron deficiency anemia, unspecified: Secondary | ICD-10-CM | POA: Diagnosis not present

## 2022-10-04 DIAGNOSIS — R911 Solitary pulmonary nodule: Secondary | ICD-10-CM | POA: Diagnosis not present

## 2022-10-04 DIAGNOSIS — R339 Retention of urine, unspecified: Secondary | ICD-10-CM | POA: Diagnosis not present

## 2022-10-04 DIAGNOSIS — K56609 Unspecified intestinal obstruction, unspecified as to partial versus complete obstruction: Secondary | ICD-10-CM | POA: Diagnosis not present

## 2022-10-04 DIAGNOSIS — I700 Atherosclerosis of aorta: Secondary | ICD-10-CM | POA: Diagnosis not present

## 2022-10-04 DIAGNOSIS — J449 Chronic obstructive pulmonary disease, unspecified: Secondary | ICD-10-CM | POA: Diagnosis not present

## 2022-10-04 DIAGNOSIS — Z87891 Personal history of nicotine dependence: Secondary | ICD-10-CM | POA: Diagnosis not present

## 2022-10-16 DIAGNOSIS — N390 Urinary tract infection, site not specified: Secondary | ICD-10-CM | POA: Diagnosis not present

## 2022-10-16 DIAGNOSIS — R300 Dysuria: Secondary | ICD-10-CM | POA: Diagnosis not present

## 2022-10-16 DIAGNOSIS — N401 Enlarged prostate with lower urinary tract symptoms: Secondary | ICD-10-CM | POA: Diagnosis not present

## 2022-10-16 DIAGNOSIS — R338 Other retention of urine: Secondary | ICD-10-CM | POA: Diagnosis not present

## 2022-10-16 DIAGNOSIS — Z8546 Personal history of malignant neoplasm of prostate: Secondary | ICD-10-CM | POA: Diagnosis not present

## 2022-10-30 DIAGNOSIS — Z7982 Long term (current) use of aspirin: Secondary | ICD-10-CM | POA: Diagnosis not present

## 2022-10-30 DIAGNOSIS — Z79899 Other long term (current) drug therapy: Secondary | ICD-10-CM | POA: Diagnosis not present

## 2022-10-30 DIAGNOSIS — Z8616 Personal history of COVID-19: Secondary | ICD-10-CM | POA: Diagnosis not present

## 2022-10-30 DIAGNOSIS — Z87891 Personal history of nicotine dependence: Secondary | ICD-10-CM | POA: Diagnosis not present

## 2022-10-30 DIAGNOSIS — K5090 Crohn's disease, unspecified, without complications: Secondary | ICD-10-CM | POA: Diagnosis not present

## 2022-10-30 DIAGNOSIS — R771 Abnormality of globulin: Secondary | ICD-10-CM | POA: Diagnosis not present

## 2022-10-30 DIAGNOSIS — D649 Anemia, unspecified: Secondary | ICD-10-CM | POA: Diagnosis not present

## 2022-10-30 DIAGNOSIS — J449 Chronic obstructive pulmonary disease, unspecified: Secondary | ICD-10-CM | POA: Diagnosis not present

## 2022-10-30 DIAGNOSIS — Z7902 Long term (current) use of antithrombotics/antiplatelets: Secondary | ICD-10-CM | POA: Diagnosis not present

## 2022-10-30 DIAGNOSIS — I10 Essential (primary) hypertension: Secondary | ICD-10-CM | POA: Diagnosis not present

## 2022-10-30 DIAGNOSIS — K219 Gastro-esophageal reflux disease without esophagitis: Secondary | ICD-10-CM | POA: Diagnosis not present

## 2022-10-30 DIAGNOSIS — D472 Monoclonal gammopathy: Secondary | ICD-10-CM | POA: Diagnosis not present

## 2022-11-13 DIAGNOSIS — Z79899 Other long term (current) drug therapy: Secondary | ICD-10-CM | POA: Diagnosis not present

## 2022-11-13 DIAGNOSIS — Z7982 Long term (current) use of aspirin: Secondary | ICD-10-CM | POA: Diagnosis not present

## 2022-11-13 DIAGNOSIS — Z87891 Personal history of nicotine dependence: Secondary | ICD-10-CM | POA: Diagnosis not present

## 2022-11-13 DIAGNOSIS — D649 Anemia, unspecified: Secondary | ICD-10-CM | POA: Diagnosis not present

## 2022-11-13 DIAGNOSIS — R771 Abnormality of globulin: Secondary | ICD-10-CM | POA: Diagnosis not present

## 2022-11-13 DIAGNOSIS — D472 Monoclonal gammopathy: Secondary | ICD-10-CM | POA: Diagnosis not present

## 2022-11-17 DIAGNOSIS — L821 Other seborrheic keratosis: Secondary | ICD-10-CM | POA: Diagnosis not present

## 2022-11-17 DIAGNOSIS — D485 Neoplasm of uncertain behavior of skin: Secondary | ICD-10-CM | POA: Diagnosis not present

## 2022-11-17 DIAGNOSIS — D2239 Melanocytic nevi of other parts of face: Secondary | ICD-10-CM | POA: Diagnosis not present

## 2022-11-17 DIAGNOSIS — C44222 Squamous cell carcinoma of skin of right ear and external auricular canal: Secondary | ICD-10-CM | POA: Diagnosis not present

## 2022-11-17 DIAGNOSIS — L570 Actinic keratosis: Secondary | ICD-10-CM | POA: Diagnosis not present

## 2022-11-17 DIAGNOSIS — C44622 Squamous cell carcinoma of skin of right upper limb, including shoulder: Secondary | ICD-10-CM | POA: Diagnosis not present

## 2022-11-17 DIAGNOSIS — L218 Other seborrheic dermatitis: Secondary | ICD-10-CM | POA: Diagnosis not present

## 2022-11-27 DIAGNOSIS — K5010 Crohn's disease of large intestine without complications: Secondary | ICD-10-CM | POA: Diagnosis not present

## 2022-11-30 DIAGNOSIS — Z9981 Dependence on supplemental oxygen: Secondary | ICD-10-CM | POA: Diagnosis not present

## 2022-11-30 DIAGNOSIS — C3412 Malignant neoplasm of upper lobe, left bronchus or lung: Secondary | ICD-10-CM | POA: Diagnosis present

## 2022-11-30 DIAGNOSIS — K5090 Crohn's disease, unspecified, without complications: Secondary | ICD-10-CM | POA: Diagnosis present

## 2022-11-30 DIAGNOSIS — Z7982 Long term (current) use of aspirin: Secondary | ICD-10-CM | POA: Diagnosis not present

## 2022-11-30 DIAGNOSIS — R0602 Shortness of breath: Secondary | ICD-10-CM | POA: Diagnosis not present

## 2022-11-30 DIAGNOSIS — J439 Emphysema, unspecified: Secondary | ICD-10-CM | POA: Diagnosis not present

## 2022-11-30 DIAGNOSIS — J441 Chronic obstructive pulmonary disease with (acute) exacerbation: Secondary | ICD-10-CM | POA: Diagnosis present

## 2022-11-30 DIAGNOSIS — Z8546 Personal history of malignant neoplasm of prostate: Secondary | ICD-10-CM | POA: Diagnosis not present

## 2022-11-30 DIAGNOSIS — D638 Anemia in other chronic diseases classified elsewhere: Secondary | ICD-10-CM | POA: Diagnosis present

## 2022-11-30 DIAGNOSIS — J9621 Acute and chronic respiratory failure with hypoxia: Secondary | ICD-10-CM | POA: Diagnosis present

## 2022-11-30 DIAGNOSIS — Z79620 Long term (current) use of immunosuppressive biologic: Secondary | ICD-10-CM | POA: Diagnosis not present

## 2022-11-30 DIAGNOSIS — D509 Iron deficiency anemia, unspecified: Secondary | ICD-10-CM | POA: Diagnosis present

## 2022-11-30 DIAGNOSIS — Z681 Body mass index (BMI) 19 or less, adult: Secondary | ICD-10-CM | POA: Diagnosis not present

## 2022-11-30 DIAGNOSIS — I479 Paroxysmal tachycardia, unspecified: Secondary | ICD-10-CM | POA: Diagnosis not present

## 2022-11-30 DIAGNOSIS — E440 Moderate protein-calorie malnutrition: Secondary | ICD-10-CM | POA: Diagnosis present

## 2022-11-30 DIAGNOSIS — Z882 Allergy status to sulfonamides status: Secondary | ICD-10-CM | POA: Diagnosis not present

## 2022-11-30 DIAGNOSIS — J431 Panlobular emphysema: Secondary | ICD-10-CM | POA: Diagnosis present

## 2022-11-30 DIAGNOSIS — Z923 Personal history of irradiation: Secondary | ICD-10-CM | POA: Diagnosis not present

## 2022-11-30 DIAGNOSIS — Z01810 Encounter for preprocedural cardiovascular examination: Secondary | ICD-10-CM | POA: Diagnosis not present

## 2022-11-30 DIAGNOSIS — I10 Essential (primary) hypertension: Secondary | ICD-10-CM | POA: Diagnosis present

## 2022-11-30 DIAGNOSIS — R000 Tachycardia, unspecified: Secondary | ICD-10-CM | POA: Diagnosis present

## 2022-11-30 DIAGNOSIS — Z87891 Personal history of nicotine dependence: Secondary | ICD-10-CM | POA: Diagnosis not present

## 2022-11-30 DIAGNOSIS — R0902 Hypoxemia: Secondary | ICD-10-CM | POA: Diagnosis not present

## 2022-11-30 DIAGNOSIS — J449 Chronic obstructive pulmonary disease, unspecified: Secondary | ICD-10-CM | POA: Diagnosis not present

## 2022-11-30 DIAGNOSIS — Z79899 Other long term (current) drug therapy: Secondary | ICD-10-CM | POA: Diagnosis not present

## 2022-11-30 DIAGNOSIS — E785 Hyperlipidemia, unspecified: Secondary | ICD-10-CM | POA: Diagnosis present

## 2022-11-30 DIAGNOSIS — Z86711 Personal history of pulmonary embolism: Secondary | ICD-10-CM | POA: Diagnosis not present

## 2022-11-30 DIAGNOSIS — R911 Solitary pulmonary nodule: Secondary | ICD-10-CM | POA: Diagnosis not present

## 2022-11-30 DIAGNOSIS — N400 Enlarged prostate without lower urinary tract symptoms: Secondary | ICD-10-CM | POA: Diagnosis present

## 2022-11-30 DIAGNOSIS — K50918 Crohn's disease, unspecified, with other complication: Secondary | ICD-10-CM | POA: Diagnosis not present

## 2022-12-01 DIAGNOSIS — K5090 Crohn's disease, unspecified, without complications: Secondary | ICD-10-CM | POA: Diagnosis present

## 2022-12-01 DIAGNOSIS — Z7982 Long term (current) use of aspirin: Secondary | ICD-10-CM | POA: Diagnosis not present

## 2022-12-01 DIAGNOSIS — Z681 Body mass index (BMI) 19 or less, adult: Secondary | ICD-10-CM | POA: Diagnosis not present

## 2022-12-01 DIAGNOSIS — Z9981 Dependence on supplemental oxygen: Secondary | ICD-10-CM | POA: Diagnosis not present

## 2022-12-01 DIAGNOSIS — Z01810 Encounter for preprocedural cardiovascular examination: Secondary | ICD-10-CM | POA: Diagnosis not present

## 2022-12-01 DIAGNOSIS — J9621 Acute and chronic respiratory failure with hypoxia: Secondary | ICD-10-CM | POA: Diagnosis present

## 2022-12-01 DIAGNOSIS — K50918 Crohn's disease, unspecified, with other complication: Secondary | ICD-10-CM | POA: Diagnosis not present

## 2022-12-01 DIAGNOSIS — Z87891 Personal history of nicotine dependence: Secondary | ICD-10-CM | POA: Diagnosis not present

## 2022-12-01 DIAGNOSIS — Z79620 Long term (current) use of immunosuppressive biologic: Secondary | ICD-10-CM | POA: Diagnosis not present

## 2022-12-01 DIAGNOSIS — Z923 Personal history of irradiation: Secondary | ICD-10-CM | POA: Diagnosis not present

## 2022-12-01 DIAGNOSIS — Z79899 Other long term (current) drug therapy: Secondary | ICD-10-CM | POA: Diagnosis not present

## 2022-12-01 DIAGNOSIS — J441 Chronic obstructive pulmonary disease with (acute) exacerbation: Secondary | ICD-10-CM | POA: Diagnosis present

## 2022-12-01 DIAGNOSIS — D638 Anemia in other chronic diseases classified elsewhere: Secondary | ICD-10-CM | POA: Diagnosis present

## 2022-12-01 DIAGNOSIS — Z86711 Personal history of pulmonary embolism: Secondary | ICD-10-CM | POA: Diagnosis not present

## 2022-12-01 DIAGNOSIS — J431 Panlobular emphysema: Secondary | ICD-10-CM | POA: Diagnosis present

## 2022-12-01 DIAGNOSIS — I10 Essential (primary) hypertension: Secondary | ICD-10-CM | POA: Diagnosis present

## 2022-12-01 DIAGNOSIS — E785 Hyperlipidemia, unspecified: Secondary | ICD-10-CM | POA: Diagnosis present

## 2022-12-01 DIAGNOSIS — D509 Iron deficiency anemia, unspecified: Secondary | ICD-10-CM | POA: Diagnosis present

## 2022-12-01 DIAGNOSIS — E440 Moderate protein-calorie malnutrition: Secondary | ICD-10-CM | POA: Diagnosis present

## 2022-12-01 DIAGNOSIS — C3412 Malignant neoplasm of upper lobe, left bronchus or lung: Secondary | ICD-10-CM | POA: Diagnosis present

## 2022-12-01 DIAGNOSIS — Z882 Allergy status to sulfonamides status: Secondary | ICD-10-CM | POA: Diagnosis not present

## 2022-12-01 DIAGNOSIS — N400 Enlarged prostate without lower urinary tract symptoms: Secondary | ICD-10-CM | POA: Diagnosis present

## 2022-12-01 DIAGNOSIS — R000 Tachycardia, unspecified: Secondary | ICD-10-CM | POA: Diagnosis present

## 2022-12-01 DIAGNOSIS — R911 Solitary pulmonary nodule: Secondary | ICD-10-CM | POA: Diagnosis not present

## 2022-12-01 DIAGNOSIS — J449 Chronic obstructive pulmonary disease, unspecified: Secondary | ICD-10-CM | POA: Diagnosis not present

## 2022-12-01 DIAGNOSIS — Z8546 Personal history of malignant neoplasm of prostate: Secondary | ICD-10-CM | POA: Diagnosis not present

## 2022-12-18 DIAGNOSIS — J449 Chronic obstructive pulmonary disease, unspecified: Secondary | ICD-10-CM | POA: Diagnosis not present

## 2022-12-18 DIAGNOSIS — Z87891 Personal history of nicotine dependence: Secondary | ICD-10-CM | POA: Diagnosis not present

## 2022-12-18 DIAGNOSIS — R911 Solitary pulmonary nodule: Secondary | ICD-10-CM | POA: Diagnosis not present

## 2022-12-21 DIAGNOSIS — D649 Anemia, unspecified: Secondary | ICD-10-CM | POA: Diagnosis not present

## 2022-12-21 DIAGNOSIS — K2100 Gastro-esophageal reflux disease with esophagitis, without bleeding: Secondary | ICD-10-CM | POA: Diagnosis not present

## 2022-12-21 DIAGNOSIS — R142 Eructation: Secondary | ICD-10-CM | POA: Diagnosis not present

## 2022-12-21 DIAGNOSIS — R5382 Chronic fatigue, unspecified: Secondary | ICD-10-CM | POA: Diagnosis not present

## 2022-12-21 DIAGNOSIS — D509 Iron deficiency anemia, unspecified: Secondary | ICD-10-CM | POA: Diagnosis not present

## 2022-12-21 DIAGNOSIS — K219 Gastro-esophageal reflux disease without esophagitis: Secondary | ICD-10-CM | POA: Diagnosis not present

## 2022-12-21 DIAGNOSIS — I10 Essential (primary) hypertension: Secondary | ICD-10-CM | POA: Diagnosis not present

## 2022-12-21 DIAGNOSIS — K5010 Crohn's disease of large intestine without complications: Secondary | ICD-10-CM | POA: Diagnosis not present

## 2022-12-25 DIAGNOSIS — E440 Moderate protein-calorie malnutrition: Secondary | ICD-10-CM | POA: Diagnosis not present

## 2022-12-25 DIAGNOSIS — K8020 Calculus of gallbladder without cholecystitis without obstruction: Secondary | ICD-10-CM | POA: Diagnosis not present

## 2022-12-25 DIAGNOSIS — K50811 Crohn's disease of both small and large intestine with rectal bleeding: Secondary | ICD-10-CM | POA: Diagnosis not present

## 2022-12-25 DIAGNOSIS — R911 Solitary pulmonary nodule: Secondary | ICD-10-CM | POA: Diagnosis not present

## 2022-12-25 DIAGNOSIS — I700 Atherosclerosis of aorta: Secondary | ICD-10-CM | POA: Diagnosis not present

## 2022-12-25 DIAGNOSIS — J449 Chronic obstructive pulmonary disease, unspecified: Secondary | ICD-10-CM | POA: Diagnosis not present

## 2022-12-25 DIAGNOSIS — K5080 Crohn's disease of both small and large intestine without complications: Secondary | ICD-10-CM | POA: Diagnosis not present

## 2022-12-25 DIAGNOSIS — K922 Gastrointestinal hemorrhage, unspecified: Secondary | ICD-10-CM | POA: Diagnosis not present

## 2022-12-25 DIAGNOSIS — J439 Emphysema, unspecified: Secondary | ICD-10-CM | POA: Diagnosis not present

## 2022-12-25 DIAGNOSIS — D509 Iron deficiency anemia, unspecified: Secondary | ICD-10-CM | POA: Diagnosis not present

## 2022-12-25 DIAGNOSIS — R000 Tachycardia, unspecified: Secondary | ICD-10-CM | POA: Diagnosis not present

## 2022-12-25 DIAGNOSIS — I5032 Chronic diastolic (congestive) heart failure: Secondary | ICD-10-CM | POA: Diagnosis not present

## 2022-12-25 DIAGNOSIS — D649 Anemia, unspecified: Secondary | ICD-10-CM | POA: Diagnosis not present

## 2022-12-25 DIAGNOSIS — K5090 Crohn's disease, unspecified, without complications: Secondary | ICD-10-CM | POA: Diagnosis not present

## 2022-12-25 DIAGNOSIS — Z681 Body mass index (BMI) 19 or less, adult: Secondary | ICD-10-CM | POA: Diagnosis not present

## 2022-12-25 DIAGNOSIS — R030 Elevated blood-pressure reading, without diagnosis of hypertension: Secondary | ICD-10-CM | POA: Diagnosis not present

## 2022-12-26 DIAGNOSIS — Z79899 Other long term (current) drug therapy: Secondary | ICD-10-CM | POA: Diagnosis not present

## 2022-12-26 DIAGNOSIS — Z7952 Long term (current) use of systemic steroids: Secondary | ICD-10-CM | POA: Diagnosis not present

## 2022-12-26 DIAGNOSIS — K50111 Crohn's disease of large intestine with rectal bleeding: Secondary | ICD-10-CM | POA: Diagnosis not present

## 2022-12-26 DIAGNOSIS — R911 Solitary pulmonary nodule: Secondary | ICD-10-CM | POA: Diagnosis not present

## 2022-12-26 DIAGNOSIS — J432 Centrilobular emphysema: Secondary | ICD-10-CM | POA: Diagnosis not present

## 2022-12-26 DIAGNOSIS — K922 Gastrointestinal hemorrhage, unspecified: Secondary | ICD-10-CM | POA: Diagnosis not present

## 2022-12-26 DIAGNOSIS — I10 Essential (primary) hypertension: Secondary | ICD-10-CM | POA: Diagnosis not present

## 2022-12-26 DIAGNOSIS — Z87891 Personal history of nicotine dependence: Secondary | ICD-10-CM | POA: Diagnosis not present

## 2022-12-26 DIAGNOSIS — Z7401 Bed confinement status: Secondary | ICD-10-CM | POA: Diagnosis not present

## 2022-12-26 DIAGNOSIS — K625 Hemorrhage of anus and rectum: Secondary | ICD-10-CM | POA: Diagnosis not present

## 2022-12-26 DIAGNOSIS — J439 Emphysema, unspecified: Secondary | ICD-10-CM | POA: Diagnosis not present

## 2022-12-26 DIAGNOSIS — D62 Acute posthemorrhagic anemia: Secondary | ICD-10-CM | POA: Diagnosis not present

## 2022-12-26 DIAGNOSIS — N400 Enlarged prostate without lower urinary tract symptoms: Secondary | ICD-10-CM | POA: Diagnosis not present

## 2022-12-26 DIAGNOSIS — E785 Hyperlipidemia, unspecified: Secondary | ICD-10-CM | POA: Diagnosis not present

## 2022-12-31 DIAGNOSIS — Z01818 Encounter for other preprocedural examination: Secondary | ICD-10-CM | POA: Diagnosis not present

## 2022-12-31 DIAGNOSIS — R918 Other nonspecific abnormal finding of lung field: Secondary | ICD-10-CM | POA: Diagnosis not present

## 2023-01-05 DIAGNOSIS — N401 Enlarged prostate with lower urinary tract symptoms: Secondary | ICD-10-CM | POA: Diagnosis not present

## 2023-01-05 DIAGNOSIS — N390 Urinary tract infection, site not specified: Secondary | ICD-10-CM | POA: Diagnosis not present

## 2023-01-05 DIAGNOSIS — Z8546 Personal history of malignant neoplasm of prostate: Secondary | ICD-10-CM | POA: Diagnosis not present

## 2023-01-05 DIAGNOSIS — R911 Solitary pulmonary nodule: Secondary | ICD-10-CM | POA: Diagnosis not present

## 2023-01-05 DIAGNOSIS — Z08 Encounter for follow-up examination after completed treatment for malignant neoplasm: Secondary | ICD-10-CM | POA: Diagnosis not present

## 2023-01-05 DIAGNOSIS — J449 Chronic obstructive pulmonary disease, unspecified: Secondary | ICD-10-CM | POA: Diagnosis not present

## 2023-01-05 DIAGNOSIS — R338 Other retention of urine: Secondary | ICD-10-CM | POA: Diagnosis not present

## 2023-01-05 DIAGNOSIS — R0609 Other forms of dyspnea: Secondary | ICD-10-CM | POA: Diagnosis not present

## 2023-01-09 DIAGNOSIS — R911 Solitary pulmonary nodule: Secondary | ICD-10-CM | POA: Diagnosis not present

## 2023-01-09 DIAGNOSIS — R918 Other nonspecific abnormal finding of lung field: Secondary | ICD-10-CM | POA: Diagnosis not present

## 2023-01-09 DIAGNOSIS — J439 Emphysema, unspecified: Secondary | ICD-10-CM | POA: Diagnosis not present

## 2023-01-12 DIAGNOSIS — R000 Tachycardia, unspecified: Secondary | ICD-10-CM | POA: Diagnosis not present

## 2023-01-12 DIAGNOSIS — K5090 Crohn's disease, unspecified, without complications: Secondary | ICD-10-CM | POA: Diagnosis not present

## 2023-01-12 DIAGNOSIS — Z79899 Other long term (current) drug therapy: Secondary | ICD-10-CM | POA: Diagnosis not present

## 2023-01-12 DIAGNOSIS — J449 Chronic obstructive pulmonary disease, unspecified: Secondary | ICD-10-CM | POA: Diagnosis not present

## 2023-01-12 DIAGNOSIS — Z87891 Personal history of nicotine dependence: Secondary | ICD-10-CM | POA: Diagnosis not present

## 2023-01-12 DIAGNOSIS — R911 Solitary pulmonary nodule: Secondary | ICD-10-CM | POA: Diagnosis not present

## 2023-01-12 DIAGNOSIS — Z9981 Dependence on supplemental oxygen: Secondary | ICD-10-CM | POA: Diagnosis not present

## 2023-01-12 DIAGNOSIS — J984 Other disorders of lung: Secondary | ICD-10-CM | POA: Diagnosis not present

## 2023-01-12 DIAGNOSIS — M1990 Unspecified osteoarthritis, unspecified site: Secondary | ICD-10-CM | POA: Diagnosis not present

## 2023-01-12 DIAGNOSIS — J9611 Chronic respiratory failure with hypoxia: Secondary | ICD-10-CM | POA: Diagnosis not present

## 2023-01-12 DIAGNOSIS — R918 Other nonspecific abnormal finding of lung field: Secondary | ICD-10-CM | POA: Diagnosis not present

## 2023-01-12 DIAGNOSIS — I10 Essential (primary) hypertension: Secondary | ICD-10-CM | POA: Diagnosis not present

## 2023-01-12 DIAGNOSIS — Z8546 Personal history of malignant neoplasm of prostate: Secondary | ICD-10-CM | POA: Diagnosis not present

## 2023-01-12 DIAGNOSIS — D649 Anemia, unspecified: Secondary | ICD-10-CM | POA: Diagnosis not present

## 2023-01-12 DIAGNOSIS — J439 Emphysema, unspecified: Secondary | ICD-10-CM | POA: Diagnosis not present

## 2023-01-12 DIAGNOSIS — K219 Gastro-esophageal reflux disease without esophagitis: Secondary | ICD-10-CM | POA: Diagnosis not present

## 2023-01-12 DIAGNOSIS — Z86711 Personal history of pulmonary embolism: Secondary | ICD-10-CM | POA: Diagnosis not present

## 2023-01-12 DIAGNOSIS — F17210 Nicotine dependence, cigarettes, uncomplicated: Secondary | ICD-10-CM | POA: Diagnosis not present

## 2023-01-12 DIAGNOSIS — Z7982 Long term (current) use of aspirin: Secondary | ICD-10-CM | POA: Diagnosis not present

## 2023-01-12 DIAGNOSIS — J159 Unspecified bacterial pneumonia: Secondary | ICD-10-CM | POA: Diagnosis not present

## 2023-01-15 DIAGNOSIS — R000 Tachycardia, unspecified: Secondary | ICD-10-CM | POA: Diagnosis not present

## 2023-01-20 DIAGNOSIS — Z9981 Dependence on supplemental oxygen: Secondary | ICD-10-CM | POA: Diagnosis not present

## 2023-01-20 DIAGNOSIS — Z87891 Personal history of nicotine dependence: Secondary | ICD-10-CM | POA: Diagnosis not present

## 2023-01-20 DIAGNOSIS — D649 Anemia, unspecified: Secondary | ICD-10-CM | POA: Diagnosis not present

## 2023-01-20 DIAGNOSIS — R0602 Shortness of breath: Secondary | ICD-10-CM | POA: Diagnosis not present

## 2023-01-20 DIAGNOSIS — E785 Hyperlipidemia, unspecified: Secondary | ICD-10-CM | POA: Diagnosis not present

## 2023-01-20 DIAGNOSIS — B371 Pulmonary candidiasis: Secondary | ICD-10-CM | POA: Diagnosis not present

## 2023-01-20 DIAGNOSIS — D638 Anemia in other chronic diseases classified elsewhere: Secondary | ICD-10-CM | POA: Diagnosis not present

## 2023-01-20 DIAGNOSIS — R911 Solitary pulmonary nodule: Secondary | ICD-10-CM | POA: Diagnosis not present

## 2023-01-20 DIAGNOSIS — J15212 Pneumonia due to Methicillin resistant Staphylococcus aureus: Secondary | ICD-10-CM | POA: Diagnosis not present

## 2023-01-20 DIAGNOSIS — J9612 Chronic respiratory failure with hypercapnia: Secondary | ICD-10-CM | POA: Diagnosis not present

## 2023-01-20 DIAGNOSIS — K50011 Crohn's disease of small intestine with rectal bleeding: Secondary | ICD-10-CM | POA: Diagnosis not present

## 2023-01-20 DIAGNOSIS — K50911 Crohn's disease, unspecified, with rectal bleeding: Secondary | ICD-10-CM | POA: Diagnosis not present

## 2023-01-20 DIAGNOSIS — Z79899 Other long term (current) drug therapy: Secondary | ICD-10-CM | POA: Diagnosis not present

## 2023-01-20 DIAGNOSIS — I2693 Single subsegmental pulmonary embolism without acute cor pulmonale: Secondary | ICD-10-CM | POA: Diagnosis not present

## 2023-01-20 DIAGNOSIS — Z86711 Personal history of pulmonary embolism: Secondary | ICD-10-CM | POA: Diagnosis not present

## 2023-01-20 DIAGNOSIS — Z1152 Encounter for screening for COVID-19: Secondary | ICD-10-CM | POA: Diagnosis not present

## 2023-01-20 DIAGNOSIS — M79604 Pain in right leg: Secondary | ICD-10-CM | POA: Diagnosis not present

## 2023-01-20 DIAGNOSIS — R0902 Hypoxemia: Secondary | ICD-10-CM | POA: Diagnosis not present

## 2023-01-20 DIAGNOSIS — I10 Essential (primary) hypertension: Secondary | ICD-10-CM | POA: Diagnosis not present

## 2023-01-20 DIAGNOSIS — M79605 Pain in left leg: Secondary | ICD-10-CM | POA: Diagnosis not present

## 2023-01-20 DIAGNOSIS — J440 Chronic obstructive pulmonary disease with acute lower respiratory infection: Secondary | ICD-10-CM | POA: Diagnosis not present

## 2023-01-20 DIAGNOSIS — J9621 Acute and chronic respiratory failure with hypoxia: Secondary | ICD-10-CM | POA: Diagnosis not present

## 2023-01-20 DIAGNOSIS — N400 Enlarged prostate without lower urinary tract symptoms: Secondary | ICD-10-CM | POA: Diagnosis not present

## 2023-01-20 DIAGNOSIS — I2699 Other pulmonary embolism without acute cor pulmonale: Secondary | ICD-10-CM | POA: Diagnosis not present

## 2023-01-20 DIAGNOSIS — J439 Emphysema, unspecified: Secondary | ICD-10-CM | POA: Diagnosis not present

## 2023-01-20 DIAGNOSIS — R918 Other nonspecific abnormal finding of lung field: Secondary | ICD-10-CM | POA: Diagnosis not present

## 2023-01-20 DIAGNOSIS — J441 Chronic obstructive pulmonary disease with (acute) exacerbation: Secondary | ICD-10-CM | POA: Diagnosis not present

## 2023-01-20 DIAGNOSIS — Z7952 Long term (current) use of systemic steroids: Secondary | ICD-10-CM | POA: Diagnosis not present

## 2023-01-20 DIAGNOSIS — Z20822 Contact with and (suspected) exposure to covid-19: Secondary | ICD-10-CM | POA: Diagnosis not present

## 2023-01-20 DIAGNOSIS — R000 Tachycardia, unspecified: Secondary | ICD-10-CM | POA: Diagnosis not present

## 2023-01-20 DIAGNOSIS — E871 Hypo-osmolality and hyponatremia: Secondary | ICD-10-CM | POA: Diagnosis not present

## 2023-01-20 DIAGNOSIS — Z8546 Personal history of malignant neoplasm of prostate: Secondary | ICD-10-CM | POA: Diagnosis not present

## 2023-01-20 DIAGNOSIS — J449 Chronic obstructive pulmonary disease, unspecified: Secondary | ICD-10-CM | POA: Diagnosis not present

## 2023-01-20 DIAGNOSIS — R069 Unspecified abnormalities of breathing: Secondary | ICD-10-CM | POA: Diagnosis not present

## 2023-01-20 DIAGNOSIS — E860 Dehydration: Secondary | ICD-10-CM | POA: Diagnosis not present

## 2023-01-20 DIAGNOSIS — Z01810 Encounter for preprocedural cardiovascular examination: Secondary | ICD-10-CM | POA: Diagnosis not present

## 2023-01-22 DIAGNOSIS — J449 Chronic obstructive pulmonary disease, unspecified: Secondary | ICD-10-CM | POA: Diagnosis not present

## 2023-01-22 DIAGNOSIS — D649 Anemia, unspecified: Secondary | ICD-10-CM | POA: Diagnosis not present

## 2023-01-26 DIAGNOSIS — J440 Chronic obstructive pulmonary disease with acute lower respiratory infection: Secondary | ICD-10-CM | POA: Diagnosis not present

## 2023-01-26 DIAGNOSIS — K5090 Crohn's disease, unspecified, without complications: Secondary | ICD-10-CM | POA: Diagnosis not present

## 2023-01-26 DIAGNOSIS — J42 Unspecified chronic bronchitis: Secondary | ICD-10-CM | POA: Diagnosis not present

## 2023-01-26 DIAGNOSIS — J9611 Chronic respiratory failure with hypoxia: Secondary | ICD-10-CM | POA: Diagnosis not present

## 2023-01-26 DIAGNOSIS — Z87891 Personal history of nicotine dependence: Secondary | ICD-10-CM | POA: Diagnosis not present

## 2023-01-26 DIAGNOSIS — J15212 Pneumonia due to Methicillin resistant Staphylococcus aureus: Secondary | ICD-10-CM | POA: Diagnosis not present

## 2023-01-26 DIAGNOSIS — I10 Essential (primary) hypertension: Secondary | ICD-10-CM | POA: Diagnosis not present

## 2023-01-26 DIAGNOSIS — E785 Hyperlipidemia, unspecified: Secondary | ICD-10-CM | POA: Diagnosis not present

## 2023-01-26 DIAGNOSIS — Z86711 Personal history of pulmonary embolism: Secondary | ICD-10-CM | POA: Diagnosis not present

## 2023-01-26 DIAGNOSIS — D509 Iron deficiency anemia, unspecified: Secondary | ICD-10-CM | POA: Diagnosis not present

## 2023-01-26 DIAGNOSIS — E860 Dehydration: Secondary | ICD-10-CM | POA: Diagnosis not present

## 2023-01-26 DIAGNOSIS — Z9181 History of falling: Secondary | ICD-10-CM | POA: Diagnosis not present

## 2023-01-26 DIAGNOSIS — Z7951 Long term (current) use of inhaled steroids: Secondary | ICD-10-CM | POA: Diagnosis not present

## 2023-01-26 DIAGNOSIS — Z9981 Dependence on supplemental oxygen: Secondary | ICD-10-CM | POA: Diagnosis not present

## 2023-01-26 DIAGNOSIS — Z7952 Long term (current) use of systemic steroids: Secondary | ICD-10-CM | POA: Diagnosis not present

## 2023-01-26 DIAGNOSIS — J441 Chronic obstructive pulmonary disease with (acute) exacerbation: Secondary | ICD-10-CM | POA: Diagnosis not present

## 2023-01-27 DIAGNOSIS — I4711 Inappropriate sinus tachycardia, so stated: Secondary | ICD-10-CM | POA: Diagnosis not present

## 2023-01-28 DIAGNOSIS — I700 Atherosclerosis of aorta: Secondary | ICD-10-CM | POA: Diagnosis not present

## 2023-01-28 DIAGNOSIS — J449 Chronic obstructive pulmonary disease, unspecified: Secondary | ICD-10-CM | POA: Diagnosis not present

## 2023-01-28 DIAGNOSIS — K6389 Other specified diseases of intestine: Secondary | ICD-10-CM | POA: Diagnosis not present

## 2023-01-28 DIAGNOSIS — D696 Thrombocytopenia, unspecified: Secondary | ICD-10-CM | POA: Diagnosis not present

## 2023-01-28 DIAGNOSIS — J189 Pneumonia, unspecified organism: Secondary | ICD-10-CM | POA: Diagnosis not present

## 2023-01-28 DIAGNOSIS — Z8701 Personal history of pneumonia (recurrent): Secondary | ICD-10-CM | POA: Diagnosis not present

## 2023-01-28 DIAGNOSIS — T380X5A Adverse effect of glucocorticoids and synthetic analogues, initial encounter: Secondary | ICD-10-CM | POA: Diagnosis not present

## 2023-01-28 DIAGNOSIS — K219 Gastro-esophageal reflux disease without esophagitis: Secondary | ICD-10-CM | POA: Diagnosis not present

## 2023-01-28 DIAGNOSIS — Z86718 Personal history of other venous thrombosis and embolism: Secondary | ICD-10-CM | POA: Diagnosis not present

## 2023-01-28 DIAGNOSIS — K565 Intestinal adhesions [bands], unspecified as to partial versus complete obstruction: Secondary | ICD-10-CM | POA: Diagnosis present

## 2023-01-28 DIAGNOSIS — R0902 Hypoxemia: Secondary | ICD-10-CM | POA: Diagnosis not present

## 2023-01-28 DIAGNOSIS — I82412 Acute embolism and thrombosis of left femoral vein: Secondary | ICD-10-CM | POA: Diagnosis not present

## 2023-01-28 DIAGNOSIS — E860 Dehydration: Secondary | ICD-10-CM | POA: Diagnosis present

## 2023-01-28 DIAGNOSIS — J15212 Pneumonia due to Methicillin resistant Staphylococcus aureus: Secondary | ICD-10-CM | POA: Diagnosis present

## 2023-01-28 DIAGNOSIS — R000 Tachycardia, unspecified: Secondary | ICD-10-CM | POA: Diagnosis not present

## 2023-01-28 DIAGNOSIS — R079 Chest pain, unspecified: Secondary | ICD-10-CM | POA: Diagnosis not present

## 2023-01-28 DIAGNOSIS — I2693 Single subsegmental pulmonary embolism without acute cor pulmonale: Secondary | ICD-10-CM | POA: Diagnosis not present

## 2023-01-28 DIAGNOSIS — R0689 Other abnormalities of breathing: Secondary | ICD-10-CM | POA: Diagnosis not present

## 2023-01-28 DIAGNOSIS — I2699 Other pulmonary embolism without acute cor pulmonale: Secondary | ICD-10-CM | POA: Diagnosis not present

## 2023-01-28 DIAGNOSIS — J441 Chronic obstructive pulmonary disease with (acute) exacerbation: Secondary | ICD-10-CM | POA: Diagnosis not present

## 2023-01-28 DIAGNOSIS — I2694 Multiple subsegmental pulmonary emboli without acute cor pulmonale: Secondary | ICD-10-CM | POA: Diagnosis not present

## 2023-01-28 DIAGNOSIS — K50918 Crohn's disease, unspecified, with other complication: Secondary | ICD-10-CM | POA: Diagnosis not present

## 2023-01-28 DIAGNOSIS — Z79899 Other long term (current) drug therapy: Secondary | ICD-10-CM | POA: Diagnosis not present

## 2023-01-28 DIAGNOSIS — D509 Iron deficiency anemia, unspecified: Secondary | ICD-10-CM | POA: Diagnosis not present

## 2023-01-28 DIAGNOSIS — I10 Essential (primary) hypertension: Secondary | ICD-10-CM | POA: Diagnosis not present

## 2023-01-28 DIAGNOSIS — Z923 Personal history of irradiation: Secondary | ICD-10-CM | POA: Diagnosis not present

## 2023-01-28 DIAGNOSIS — R0602 Shortness of breath: Secondary | ICD-10-CM | POA: Diagnosis not present

## 2023-01-28 DIAGNOSIS — J440 Chronic obstructive pulmonary disease with acute lower respiratory infection: Secondary | ICD-10-CM | POA: Diagnosis not present

## 2023-01-28 DIAGNOSIS — J9621 Acute and chronic respiratory failure with hypoxia: Secondary | ICD-10-CM | POA: Diagnosis present

## 2023-01-28 DIAGNOSIS — K50111 Crohn's disease of large intestine with rectal bleeding: Secondary | ICD-10-CM | POA: Diagnosis not present

## 2023-01-28 DIAGNOSIS — N400 Enlarged prostate without lower urinary tract symptoms: Secondary | ICD-10-CM | POA: Diagnosis not present

## 2023-01-28 DIAGNOSIS — I4510 Unspecified right bundle-branch block: Secondary | ICD-10-CM | POA: Diagnosis not present

## 2023-01-28 DIAGNOSIS — Z510 Encounter for antineoplastic radiation therapy: Secondary | ICD-10-CM | POA: Diagnosis not present

## 2023-01-28 DIAGNOSIS — Z794 Long term (current) use of insulin: Secondary | ICD-10-CM | POA: Diagnosis not present

## 2023-01-28 DIAGNOSIS — E785 Hyperlipidemia, unspecified: Secondary | ICD-10-CM | POA: Diagnosis present

## 2023-01-28 DIAGNOSIS — E440 Moderate protein-calorie malnutrition: Secondary | ICD-10-CM | POA: Diagnosis not present

## 2023-01-28 DIAGNOSIS — K5090 Crohn's disease, unspecified, without complications: Secondary | ICD-10-CM | POA: Diagnosis present

## 2023-01-28 DIAGNOSIS — J9610 Chronic respiratory failure, unspecified whether with hypoxia or hypercapnia: Secondary | ICD-10-CM | POA: Diagnosis present

## 2023-01-28 DIAGNOSIS — Z87891 Personal history of nicotine dependence: Secondary | ICD-10-CM | POA: Diagnosis not present

## 2023-01-28 DIAGNOSIS — Z8546 Personal history of malignant neoplasm of prostate: Secondary | ICD-10-CM | POA: Diagnosis not present

## 2023-01-28 DIAGNOSIS — J439 Emphysema, unspecified: Secondary | ICD-10-CM | POA: Diagnosis present

## 2023-01-28 DIAGNOSIS — A419 Sepsis, unspecified organism: Secondary | ICD-10-CM | POA: Diagnosis not present

## 2023-01-28 DIAGNOSIS — C3412 Malignant neoplasm of upper lobe, left bronchus or lung: Secondary | ICD-10-CM | POA: Diagnosis present

## 2023-01-28 DIAGNOSIS — Z882 Allergy status to sulfonamides status: Secondary | ICD-10-CM | POA: Diagnosis not present

## 2023-01-28 DIAGNOSIS — I82409 Acute embolism and thrombosis of unspecified deep veins of unspecified lower extremity: Secondary | ICD-10-CM | POA: Diagnosis not present

## 2023-01-28 DIAGNOSIS — E1165 Type 2 diabetes mellitus with hyperglycemia: Secondary | ICD-10-CM | POA: Diagnosis not present

## 2023-01-28 DIAGNOSIS — R1312 Dysphagia, oropharyngeal phase: Secondary | ICD-10-CM | POA: Diagnosis not present

## 2023-01-28 DIAGNOSIS — R001 Bradycardia, unspecified: Secondary | ICD-10-CM | POA: Diagnosis not present

## 2023-01-28 DIAGNOSIS — I82491 Acute embolism and thrombosis of other specified deep vein of right lower extremity: Secondary | ICD-10-CM | POA: Diagnosis not present

## 2023-01-28 DIAGNOSIS — D62 Acute posthemorrhagic anemia: Secondary | ICD-10-CM | POA: Diagnosis present

## 2023-01-28 DIAGNOSIS — I5032 Chronic diastolic (congestive) heart failure: Secondary | ICD-10-CM | POA: Diagnosis not present

## 2023-01-28 DIAGNOSIS — Z743 Need for continuous supervision: Secondary | ICD-10-CM | POA: Diagnosis not present

## 2023-01-28 DIAGNOSIS — R739 Hyperglycemia, unspecified: Secondary | ICD-10-CM | POA: Diagnosis not present

## 2023-01-28 DIAGNOSIS — R911 Solitary pulmonary nodule: Secondary | ICD-10-CM | POA: Diagnosis not present

## 2023-01-28 DIAGNOSIS — J42 Unspecified chronic bronchitis: Secondary | ICD-10-CM | POA: Diagnosis not present

## 2023-01-28 DIAGNOSIS — B49 Unspecified mycosis: Secondary | ICD-10-CM | POA: Diagnosis not present

## 2023-01-28 DIAGNOSIS — Z8614 Personal history of Methicillin resistant Staphylococcus aureus infection: Secondary | ICD-10-CM | POA: Diagnosis not present

## 2023-01-28 DIAGNOSIS — J9611 Chronic respiratory failure with hypoxia: Secondary | ICD-10-CM | POA: Diagnosis not present

## 2023-01-28 DIAGNOSIS — I82411 Acute embolism and thrombosis of right femoral vein: Secondary | ICD-10-CM | POA: Diagnosis not present

## 2023-01-29 DIAGNOSIS — J9611 Chronic respiratory failure with hypoxia: Secondary | ICD-10-CM | POA: Diagnosis not present

## 2023-01-29 DIAGNOSIS — J441 Chronic obstructive pulmonary disease with (acute) exacerbation: Secondary | ICD-10-CM | POA: Diagnosis not present

## 2023-01-29 DIAGNOSIS — J440 Chronic obstructive pulmonary disease with acute lower respiratory infection: Secondary | ICD-10-CM | POA: Diagnosis not present

## 2023-01-29 DIAGNOSIS — J42 Unspecified chronic bronchitis: Secondary | ICD-10-CM | POA: Diagnosis not present

## 2023-01-29 DIAGNOSIS — J15212 Pneumonia due to Methicillin resistant Staphylococcus aureus: Secondary | ICD-10-CM | POA: Diagnosis not present

## 2023-01-29 DIAGNOSIS — I10 Essential (primary) hypertension: Secondary | ICD-10-CM | POA: Diagnosis not present

## 2023-01-31 DIAGNOSIS — R000 Tachycardia, unspecified: Secondary | ICD-10-CM | POA: Diagnosis present

## 2023-01-31 DIAGNOSIS — K565 Intestinal adhesions [bands], unspecified as to partial versus complete obstruction: Secondary | ICD-10-CM | POA: Diagnosis present

## 2023-01-31 DIAGNOSIS — R001 Bradycardia, unspecified: Secondary | ICD-10-CM | POA: Diagnosis not present

## 2023-01-31 DIAGNOSIS — R079 Chest pain, unspecified: Secondary | ICD-10-CM | POA: Diagnosis not present

## 2023-01-31 DIAGNOSIS — R0689 Other abnormalities of breathing: Secondary | ICD-10-CM | POA: Diagnosis not present

## 2023-01-31 DIAGNOSIS — I10 Essential (primary) hypertension: Secondary | ICD-10-CM | POA: Diagnosis present

## 2023-01-31 DIAGNOSIS — I82409 Acute embolism and thrombosis of unspecified deep veins of unspecified lower extremity: Secondary | ICD-10-CM | POA: Diagnosis not present

## 2023-01-31 DIAGNOSIS — C3412 Malignant neoplasm of upper lobe, left bronchus or lung: Secondary | ICD-10-CM | POA: Diagnosis present

## 2023-01-31 DIAGNOSIS — J440 Chronic obstructive pulmonary disease with acute lower respiratory infection: Secondary | ICD-10-CM | POA: Diagnosis not present

## 2023-01-31 DIAGNOSIS — J441 Chronic obstructive pulmonary disease with (acute) exacerbation: Secondary | ICD-10-CM | POA: Diagnosis not present

## 2023-01-31 DIAGNOSIS — R0602 Shortness of breath: Secondary | ICD-10-CM | POA: Diagnosis not present

## 2023-01-31 DIAGNOSIS — E1165 Type 2 diabetes mellitus with hyperglycemia: Secondary | ICD-10-CM | POA: Diagnosis not present

## 2023-01-31 DIAGNOSIS — I2694 Multiple subsegmental pulmonary emboli without acute cor pulmonale: Secondary | ICD-10-CM | POA: Diagnosis not present

## 2023-01-31 DIAGNOSIS — D696 Thrombocytopenia, unspecified: Secondary | ICD-10-CM | POA: Diagnosis not present

## 2023-01-31 DIAGNOSIS — K5090 Crohn's disease, unspecified, without complications: Secondary | ICD-10-CM | POA: Diagnosis present

## 2023-01-31 DIAGNOSIS — K50918 Crohn's disease, unspecified, with other complication: Secondary | ICD-10-CM | POA: Diagnosis not present

## 2023-01-31 DIAGNOSIS — D62 Acute posthemorrhagic anemia: Secondary | ICD-10-CM | POA: Diagnosis present

## 2023-01-31 DIAGNOSIS — K6389 Other specified diseases of intestine: Secondary | ICD-10-CM | POA: Diagnosis not present

## 2023-01-31 DIAGNOSIS — A419 Sepsis, unspecified organism: Secondary | ICD-10-CM | POA: Diagnosis not present

## 2023-01-31 DIAGNOSIS — I4510 Unspecified right bundle-branch block: Secondary | ICD-10-CM | POA: Diagnosis not present

## 2023-01-31 DIAGNOSIS — Z79899 Other long term (current) drug therapy: Secondary | ICD-10-CM | POA: Diagnosis not present

## 2023-01-31 DIAGNOSIS — J15212 Pneumonia due to Methicillin resistant Staphylococcus aureus: Secondary | ICD-10-CM | POA: Diagnosis present

## 2023-01-31 DIAGNOSIS — I2699 Other pulmonary embolism without acute cor pulmonale: Secondary | ICD-10-CM | POA: Diagnosis not present

## 2023-01-31 DIAGNOSIS — J189 Pneumonia, unspecified organism: Secondary | ICD-10-CM | POA: Diagnosis not present

## 2023-01-31 DIAGNOSIS — Z8546 Personal history of malignant neoplasm of prostate: Secondary | ICD-10-CM | POA: Diagnosis not present

## 2023-01-31 DIAGNOSIS — Z923 Personal history of irradiation: Secondary | ICD-10-CM | POA: Diagnosis not present

## 2023-01-31 DIAGNOSIS — Z743 Need for continuous supervision: Secondary | ICD-10-CM | POA: Diagnosis not present

## 2023-01-31 DIAGNOSIS — I82491 Acute embolism and thrombosis of other specified deep vein of right lower extremity: Secondary | ICD-10-CM | POA: Diagnosis not present

## 2023-01-31 DIAGNOSIS — I82411 Acute embolism and thrombosis of right femoral vein: Secondary | ICD-10-CM | POA: Diagnosis not present

## 2023-01-31 DIAGNOSIS — R739 Hyperglycemia, unspecified: Secondary | ICD-10-CM | POA: Diagnosis not present

## 2023-01-31 DIAGNOSIS — Z510 Encounter for antineoplastic radiation therapy: Secondary | ICD-10-CM | POA: Diagnosis not present

## 2023-01-31 DIAGNOSIS — J439 Emphysema, unspecified: Secondary | ICD-10-CM | POA: Diagnosis present

## 2023-01-31 DIAGNOSIS — Z86718 Personal history of other venous thrombosis and embolism: Secondary | ICD-10-CM | POA: Diagnosis not present

## 2023-01-31 DIAGNOSIS — R0902 Hypoxemia: Secondary | ICD-10-CM | POA: Diagnosis not present

## 2023-01-31 DIAGNOSIS — J9610 Chronic respiratory failure, unspecified whether with hypoxia or hypercapnia: Secondary | ICD-10-CM | POA: Diagnosis present

## 2023-01-31 DIAGNOSIS — I82412 Acute embolism and thrombosis of left femoral vein: Secondary | ICD-10-CM | POA: Diagnosis not present

## 2023-01-31 DIAGNOSIS — I2693 Single subsegmental pulmonary embolism without acute cor pulmonale: Secondary | ICD-10-CM | POA: Diagnosis not present

## 2023-01-31 DIAGNOSIS — J9621 Acute and chronic respiratory failure with hypoxia: Secondary | ICD-10-CM | POA: Diagnosis present

## 2023-01-31 DIAGNOSIS — E785 Hyperlipidemia, unspecified: Secondary | ICD-10-CM | POA: Diagnosis present

## 2023-01-31 DIAGNOSIS — E860 Dehydration: Secondary | ICD-10-CM | POA: Diagnosis present

## 2023-01-31 DIAGNOSIS — Z87891 Personal history of nicotine dependence: Secondary | ICD-10-CM | POA: Diagnosis not present

## 2023-02-01 DIAGNOSIS — J189 Pneumonia, unspecified organism: Secondary | ICD-10-CM | POA: Diagnosis not present

## 2023-02-01 DIAGNOSIS — J9621 Acute and chronic respiratory failure with hypoxia: Secondary | ICD-10-CM | POA: Diagnosis not present

## 2023-02-01 DIAGNOSIS — J441 Chronic obstructive pulmonary disease with (acute) exacerbation: Secondary | ICD-10-CM | POA: Diagnosis not present

## 2023-02-02 DIAGNOSIS — I82411 Acute embolism and thrombosis of right femoral vein: Secondary | ICD-10-CM | POA: Diagnosis not present

## 2023-02-02 DIAGNOSIS — J441 Chronic obstructive pulmonary disease with (acute) exacerbation: Secondary | ICD-10-CM | POA: Diagnosis not present

## 2023-02-02 DIAGNOSIS — D62 Acute posthemorrhagic anemia: Secondary | ICD-10-CM | POA: Diagnosis not present

## 2023-02-02 DIAGNOSIS — K50918 Crohn's disease, unspecified, with other complication: Secondary | ICD-10-CM | POA: Diagnosis not present

## 2023-02-02 DIAGNOSIS — J189 Pneumonia, unspecified organism: Secondary | ICD-10-CM | POA: Diagnosis not present

## 2023-02-02 DIAGNOSIS — I2699 Other pulmonary embolism without acute cor pulmonale: Secondary | ICD-10-CM | POA: Diagnosis not present

## 2023-02-02 DIAGNOSIS — J9621 Acute and chronic respiratory failure with hypoxia: Secondary | ICD-10-CM | POA: Diagnosis not present

## 2023-02-03 DIAGNOSIS — I2699 Other pulmonary embolism without acute cor pulmonale: Secondary | ICD-10-CM | POA: Diagnosis not present

## 2023-02-03 DIAGNOSIS — D62 Acute posthemorrhagic anemia: Secondary | ICD-10-CM | POA: Diagnosis not present

## 2023-02-03 DIAGNOSIS — I82411 Acute embolism and thrombosis of right femoral vein: Secondary | ICD-10-CM | POA: Diagnosis not present

## 2023-02-03 DIAGNOSIS — J189 Pneumonia, unspecified organism: Secondary | ICD-10-CM | POA: Diagnosis not present

## 2023-02-03 DIAGNOSIS — J441 Chronic obstructive pulmonary disease with (acute) exacerbation: Secondary | ICD-10-CM | POA: Diagnosis not present

## 2023-02-03 DIAGNOSIS — K50918 Crohn's disease, unspecified, with other complication: Secondary | ICD-10-CM | POA: Diagnosis not present

## 2023-02-03 DIAGNOSIS — J9621 Acute and chronic respiratory failure with hypoxia: Secondary | ICD-10-CM | POA: Diagnosis not present

## 2023-02-04 DIAGNOSIS — K219 Gastro-esophageal reflux disease without esophagitis: Secondary | ICD-10-CM | POA: Diagnosis not present

## 2023-02-04 DIAGNOSIS — Z1159 Encounter for screening for other viral diseases: Secondary | ICD-10-CM | POA: Diagnosis not present

## 2023-02-04 DIAGNOSIS — E785 Hyperlipidemia, unspecified: Secondary | ICD-10-CM | POA: Diagnosis not present

## 2023-02-04 DIAGNOSIS — E0865 Diabetes mellitus due to underlying condition with hyperglycemia: Secondary | ICD-10-CM | POA: Diagnosis not present

## 2023-02-04 DIAGNOSIS — K50111 Crohn's disease of large intestine with rectal bleeding: Secondary | ICD-10-CM | POA: Diagnosis not present

## 2023-02-04 DIAGNOSIS — I499 Cardiac arrhythmia, unspecified: Secondary | ICD-10-CM | POA: Diagnosis not present

## 2023-02-04 DIAGNOSIS — Z87891 Personal history of nicotine dependence: Secondary | ICD-10-CM | POA: Diagnosis not present

## 2023-02-04 DIAGNOSIS — I743 Embolism and thrombosis of arteries of the lower extremities: Secondary | ICD-10-CM | POA: Diagnosis not present

## 2023-02-04 DIAGNOSIS — Z8614 Personal history of Methicillin resistant Staphylococcus aureus infection: Secondary | ICD-10-CM | POA: Diagnosis not present

## 2023-02-04 DIAGNOSIS — N400 Enlarged prostate without lower urinary tract symptoms: Secondary | ICD-10-CM | POA: Diagnosis not present

## 2023-02-04 DIAGNOSIS — J439 Emphysema, unspecified: Secondary | ICD-10-CM | POA: Diagnosis not present

## 2023-02-04 DIAGNOSIS — Z794 Long term (current) use of insulin: Secondary | ICD-10-CM | POA: Diagnosis not present

## 2023-02-04 DIAGNOSIS — Z8701 Personal history of pneumonia (recurrent): Secondary | ICD-10-CM | POA: Diagnosis not present

## 2023-02-04 DIAGNOSIS — R000 Tachycardia, unspecified: Secondary | ICD-10-CM | POA: Diagnosis not present

## 2023-02-04 DIAGNOSIS — K5000 Crohn's disease of small intestine without complications: Secondary | ICD-10-CM | POA: Diagnosis not present

## 2023-02-04 DIAGNOSIS — R001 Bradycardia, unspecified: Secondary | ICD-10-CM | POA: Diagnosis not present

## 2023-02-04 DIAGNOSIS — D696 Thrombocytopenia, unspecified: Secondary | ICD-10-CM | POA: Diagnosis not present

## 2023-02-04 DIAGNOSIS — J449 Chronic obstructive pulmonary disease, unspecified: Secondary | ICD-10-CM | POA: Diagnosis not present

## 2023-02-04 DIAGNOSIS — I82412 Acute embolism and thrombosis of left femoral vein: Secondary | ICD-10-CM | POA: Diagnosis not present

## 2023-02-04 DIAGNOSIS — I2699 Other pulmonary embolism without acute cor pulmonale: Secondary | ICD-10-CM | POA: Diagnosis not present

## 2023-02-04 DIAGNOSIS — R262 Difficulty in walking, not elsewhere classified: Secondary | ICD-10-CM | POA: Diagnosis not present

## 2023-02-04 DIAGNOSIS — I4510 Unspecified right bundle-branch block: Secondary | ICD-10-CM | POA: Diagnosis not present

## 2023-02-04 DIAGNOSIS — I824Z2 Acute embolism and thrombosis of unspecified deep veins of left distal lower extremity: Secondary | ICD-10-CM | POA: Diagnosis not present

## 2023-02-04 DIAGNOSIS — I2693 Single subsegmental pulmonary embolism without acute cor pulmonale: Secondary | ICD-10-CM | POA: Diagnosis not present

## 2023-02-04 DIAGNOSIS — I82491 Acute embolism and thrombosis of other specified deep vein of right lower extremity: Secondary | ICD-10-CM | POA: Diagnosis not present

## 2023-02-04 DIAGNOSIS — R1312 Dysphagia, oropharyngeal phase: Secondary | ICD-10-CM | POA: Diagnosis not present

## 2023-02-04 DIAGNOSIS — R0602 Shortness of breath: Secondary | ICD-10-CM | POA: Diagnosis not present

## 2023-02-04 DIAGNOSIS — M6281 Muscle weakness (generalized): Secondary | ICD-10-CM | POA: Diagnosis not present

## 2023-02-04 DIAGNOSIS — I10 Essential (primary) hypertension: Secondary | ICD-10-CM | POA: Diagnosis not present

## 2023-02-04 DIAGNOSIS — R2681 Unsteadiness on feet: Secondary | ICD-10-CM | POA: Diagnosis not present

## 2023-02-04 DIAGNOSIS — R633 Feeding difficulties, unspecified: Secondary | ICD-10-CM | POA: Diagnosis not present

## 2023-02-04 DIAGNOSIS — J440 Chronic obstructive pulmonary disease with acute lower respiratory infection: Secondary | ICD-10-CM | POA: Diagnosis not present

## 2023-02-04 DIAGNOSIS — Z79899 Other long term (current) drug therapy: Secondary | ICD-10-CM | POA: Diagnosis not present

## 2023-02-04 DIAGNOSIS — E1165 Type 2 diabetes mellitus with hyperglycemia: Secondary | ICD-10-CM | POA: Diagnosis not present

## 2023-02-04 DIAGNOSIS — I452 Bifascicular block: Secondary | ICD-10-CM | POA: Diagnosis not present

## 2023-02-04 DIAGNOSIS — Z882 Allergy status to sulfonamides status: Secondary | ICD-10-CM | POA: Diagnosis not present

## 2023-02-04 DIAGNOSIS — T380X5A Adverse effect of glucocorticoids and synthetic analogues, initial encounter: Secondary | ICD-10-CM | POA: Diagnosis not present

## 2023-02-05 DIAGNOSIS — I2699 Other pulmonary embolism without acute cor pulmonale: Secondary | ICD-10-CM | POA: Diagnosis not present

## 2023-02-06 DIAGNOSIS — R0602 Shortness of breath: Secondary | ICD-10-CM | POA: Diagnosis not present

## 2023-02-06 DIAGNOSIS — I2699 Other pulmonary embolism without acute cor pulmonale: Secondary | ICD-10-CM | POA: Diagnosis not present

## 2023-02-07 DIAGNOSIS — I499 Cardiac arrhythmia, unspecified: Secondary | ICD-10-CM | POA: Diagnosis not present

## 2023-02-07 DIAGNOSIS — I452 Bifascicular block: Secondary | ICD-10-CM | POA: Diagnosis not present

## 2023-02-07 DIAGNOSIS — I2699 Other pulmonary embolism without acute cor pulmonale: Secondary | ICD-10-CM | POA: Diagnosis not present

## 2023-02-07 DIAGNOSIS — R000 Tachycardia, unspecified: Secondary | ICD-10-CM | POA: Diagnosis not present

## 2023-02-08 DIAGNOSIS — I2699 Other pulmonary embolism without acute cor pulmonale: Secondary | ICD-10-CM | POA: Diagnosis not present

## 2023-02-09 DIAGNOSIS — I2699 Other pulmonary embolism without acute cor pulmonale: Secondary | ICD-10-CM | POA: Diagnosis not present

## 2023-02-10 DIAGNOSIS — Z510 Encounter for antineoplastic radiation therapy: Secondary | ICD-10-CM | POA: Diagnosis not present

## 2023-02-10 DIAGNOSIS — Z923 Personal history of irradiation: Secondary | ICD-10-CM | POA: Diagnosis not present

## 2023-02-10 DIAGNOSIS — J15212 Pneumonia due to Methicillin resistant Staphylococcus aureus: Secondary | ICD-10-CM | POA: Diagnosis not present

## 2023-02-10 DIAGNOSIS — J441 Chronic obstructive pulmonary disease with (acute) exacerbation: Secondary | ICD-10-CM | POA: Diagnosis not present

## 2023-02-10 DIAGNOSIS — B49 Unspecified mycosis: Secondary | ICD-10-CM | POA: Diagnosis not present

## 2023-02-10 DIAGNOSIS — J439 Emphysema, unspecified: Secondary | ICD-10-CM | POA: Diagnosis not present

## 2023-02-10 DIAGNOSIS — F39 Unspecified mood [affective] disorder: Secondary | ICD-10-CM | POA: Diagnosis not present

## 2023-02-10 DIAGNOSIS — R531 Weakness: Secondary | ICD-10-CM | POA: Diagnosis not present

## 2023-02-10 DIAGNOSIS — Z5189 Encounter for other specified aftercare: Secondary | ICD-10-CM | POA: Diagnosis not present

## 2023-02-10 DIAGNOSIS — J3801 Paralysis of vocal cords and larynx, unilateral: Secondary | ICD-10-CM | POA: Diagnosis not present

## 2023-02-10 DIAGNOSIS — R918 Other nonspecific abnormal finding of lung field: Secondary | ICD-10-CM | POA: Diagnosis not present

## 2023-02-10 DIAGNOSIS — R600 Localized edema: Secondary | ICD-10-CM | POA: Diagnosis not present

## 2023-02-10 DIAGNOSIS — R0602 Shortness of breath: Secondary | ICD-10-CM | POA: Diagnosis not present

## 2023-02-10 DIAGNOSIS — Z20822 Contact with and (suspected) exposure to covid-19: Secondary | ICD-10-CM | POA: Diagnosis not present

## 2023-02-10 DIAGNOSIS — R0989 Other specified symptoms and signs involving the circulatory and respiratory systems: Secondary | ICD-10-CM | POA: Diagnosis not present

## 2023-02-10 DIAGNOSIS — R2681 Unsteadiness on feet: Secondary | ICD-10-CM | POA: Diagnosis not present

## 2023-02-10 DIAGNOSIS — I5032 Chronic diastolic (congestive) heart failure: Secondary | ICD-10-CM | POA: Diagnosis not present

## 2023-02-10 DIAGNOSIS — L89311 Pressure ulcer of right buttock, stage 1: Secondary | ICD-10-CM | POA: Diagnosis not present

## 2023-02-10 DIAGNOSIS — R1312 Dysphagia, oropharyngeal phase: Secondary | ICD-10-CM | POA: Diagnosis not present

## 2023-02-10 DIAGNOSIS — M6281 Muscle weakness (generalized): Secondary | ICD-10-CM | POA: Diagnosis not present

## 2023-02-10 DIAGNOSIS — R911 Solitary pulmonary nodule: Secondary | ICD-10-CM | POA: Diagnosis not present

## 2023-02-10 DIAGNOSIS — R262 Difficulty in walking, not elsewhere classified: Secondary | ICD-10-CM | POA: Diagnosis not present

## 2023-02-10 DIAGNOSIS — E440 Moderate protein-calorie malnutrition: Secondary | ICD-10-CM | POA: Diagnosis not present

## 2023-02-10 DIAGNOSIS — K5090 Crohn's disease, unspecified, without complications: Secondary | ICD-10-CM | POA: Diagnosis not present

## 2023-02-10 DIAGNOSIS — I700 Atherosclerosis of aorta: Secondary | ICD-10-CM | POA: Diagnosis not present

## 2023-02-10 DIAGNOSIS — I10 Essential (primary) hypertension: Secondary | ICD-10-CM | POA: Diagnosis not present

## 2023-02-10 DIAGNOSIS — B369 Superficial mycosis, unspecified: Secondary | ICD-10-CM | POA: Diagnosis not present

## 2023-02-10 DIAGNOSIS — E785 Hyperlipidemia, unspecified: Secondary | ICD-10-CM | POA: Diagnosis not present

## 2023-02-10 DIAGNOSIS — J069 Acute upper respiratory infection, unspecified: Secondary | ICD-10-CM | POA: Diagnosis not present

## 2023-02-10 DIAGNOSIS — E119 Type 2 diabetes mellitus without complications: Secondary | ICD-10-CM | POA: Diagnosis not present

## 2023-02-10 DIAGNOSIS — R059 Cough, unspecified: Secondary | ICD-10-CM | POA: Diagnosis not present

## 2023-02-10 DIAGNOSIS — Z7401 Bed confinement status: Secondary | ICD-10-CM | POA: Diagnosis not present

## 2023-02-10 DIAGNOSIS — R296 Repeated falls: Secondary | ICD-10-CM | POA: Diagnosis not present

## 2023-02-10 DIAGNOSIS — Z8546 Personal history of malignant neoplasm of prostate: Secondary | ICD-10-CM | POA: Diagnosis not present

## 2023-02-10 DIAGNOSIS — Z87891 Personal history of nicotine dependence: Secondary | ICD-10-CM | POA: Diagnosis not present

## 2023-02-10 DIAGNOSIS — Z1159 Encounter for screening for other viral diseases: Secondary | ICD-10-CM | POA: Diagnosis not present

## 2023-02-10 DIAGNOSIS — K219 Gastro-esophageal reflux disease without esophagitis: Secondary | ICD-10-CM | POA: Diagnosis not present

## 2023-02-10 DIAGNOSIS — J9610 Chronic respiratory failure, unspecified whether with hypoxia or hypercapnia: Secondary | ICD-10-CM | POA: Diagnosis not present

## 2023-02-10 DIAGNOSIS — Z86711 Personal history of pulmonary embolism: Secondary | ICD-10-CM | POA: Diagnosis not present

## 2023-02-10 DIAGNOSIS — D509 Iron deficiency anemia, unspecified: Secondary | ICD-10-CM | POA: Diagnosis not present

## 2023-02-10 DIAGNOSIS — I743 Embolism and thrombosis of arteries of the lower extremities: Secondary | ICD-10-CM | POA: Diagnosis not present

## 2023-02-10 DIAGNOSIS — J440 Chronic obstructive pulmonary disease with acute lower respiratory infection: Secondary | ICD-10-CM | POA: Diagnosis not present

## 2023-02-10 DIAGNOSIS — J189 Pneumonia, unspecified organism: Secondary | ICD-10-CM | POA: Diagnosis not present

## 2023-02-10 DIAGNOSIS — N400 Enlarged prostate without lower urinary tract symptoms: Secondary | ICD-10-CM | POA: Diagnosis not present

## 2023-02-10 DIAGNOSIS — Z01818 Encounter for other preprocedural examination: Secondary | ICD-10-CM | POA: Diagnosis not present

## 2023-02-10 DIAGNOSIS — J42 Unspecified chronic bronchitis: Secondary | ICD-10-CM | POA: Diagnosis not present

## 2023-02-10 DIAGNOSIS — R000 Tachycardia, unspecified: Secondary | ICD-10-CM | POA: Diagnosis not present

## 2023-02-10 DIAGNOSIS — E1165 Type 2 diabetes mellitus with hyperglycemia: Secondary | ICD-10-CM | POA: Diagnosis not present

## 2023-02-10 DIAGNOSIS — T380X5A Adverse effect of glucocorticoids and synthetic analogues, initial encounter: Secondary | ICD-10-CM | POA: Diagnosis not present

## 2023-02-10 DIAGNOSIS — K8020 Calculus of gallbladder without cholecystitis without obstruction: Secondary | ICD-10-CM | POA: Diagnosis not present

## 2023-02-10 DIAGNOSIS — Z1152 Encounter for screening for COVID-19: Secondary | ICD-10-CM | POA: Diagnosis not present

## 2023-02-10 DIAGNOSIS — K50111 Crohn's disease of large intestine with rectal bleeding: Secondary | ICD-10-CM | POA: Diagnosis not present

## 2023-02-10 DIAGNOSIS — Z794 Long term (current) use of insulin: Secondary | ICD-10-CM | POA: Diagnosis not present

## 2023-02-10 DIAGNOSIS — C3412 Malignant neoplasm of upper lobe, left bronchus or lung: Secondary | ICD-10-CM | POA: Diagnosis not present

## 2023-02-10 DIAGNOSIS — R6889 Other general symptoms and signs: Secondary | ICD-10-CM | POA: Diagnosis not present

## 2023-02-10 DIAGNOSIS — D696 Thrombocytopenia, unspecified: Secondary | ICD-10-CM | POA: Diagnosis not present

## 2023-02-10 DIAGNOSIS — R197 Diarrhea, unspecified: Secondary | ICD-10-CM | POA: Diagnosis not present

## 2023-02-10 DIAGNOSIS — J988 Other specified respiratory disorders: Secondary | ICD-10-CM | POA: Diagnosis not present

## 2023-02-10 DIAGNOSIS — I2699 Other pulmonary embolism without acute cor pulmonale: Secondary | ICD-10-CM | POA: Diagnosis not present

## 2023-02-10 DIAGNOSIS — J9811 Atelectasis: Secondary | ICD-10-CM | POA: Diagnosis not present

## 2023-02-10 DIAGNOSIS — K5641 Fecal impaction: Secondary | ICD-10-CM | POA: Diagnosis not present

## 2023-02-10 DIAGNOSIS — R109 Unspecified abdominal pain: Secondary | ICD-10-CM | POA: Diagnosis not present

## 2023-02-10 DIAGNOSIS — R221 Localized swelling, mass and lump, neck: Secondary | ICD-10-CM | POA: Diagnosis not present

## 2023-02-10 DIAGNOSIS — M799 Soft tissue disorder, unspecified: Secondary | ICD-10-CM | POA: Diagnosis not present

## 2023-02-10 DIAGNOSIS — Z9189 Other specified personal risk factors, not elsewhere classified: Secondary | ICD-10-CM | POA: Diagnosis not present

## 2023-02-10 DIAGNOSIS — E0865 Diabetes mellitus due to underlying condition with hyperglycemia: Secondary | ICD-10-CM | POA: Diagnosis not present

## 2023-02-10 DIAGNOSIS — A0472 Enterocolitis due to Clostridium difficile, not specified as recurrent: Secondary | ICD-10-CM | POA: Diagnosis not present

## 2023-02-10 DIAGNOSIS — I824Z2 Acute embolism and thrombosis of unspecified deep veins of left distal lower extremity: Secondary | ICD-10-CM | POA: Diagnosis not present

## 2023-02-10 DIAGNOSIS — Z79899 Other long term (current) drug therapy: Secondary | ICD-10-CM | POA: Diagnosis not present

## 2023-02-10 DIAGNOSIS — J449 Chronic obstructive pulmonary disease, unspecified: Secondary | ICD-10-CM | POA: Diagnosis not present

## 2023-02-10 DIAGNOSIS — Z9981 Dependence on supplemental oxygen: Secondary | ICD-10-CM | POA: Diagnosis not present

## 2023-02-10 DIAGNOSIS — R5381 Other malaise: Secondary | ICD-10-CM | POA: Diagnosis not present

## 2023-02-10 DIAGNOSIS — J9611 Chronic respiratory failure with hypoxia: Secondary | ICD-10-CM | POA: Diagnosis not present

## 2023-02-10 DIAGNOSIS — K5000 Crohn's disease of small intestine without complications: Secondary | ICD-10-CM | POA: Diagnosis not present

## 2023-02-11 DIAGNOSIS — J449 Chronic obstructive pulmonary disease, unspecified: Secondary | ICD-10-CM | POA: Diagnosis not present

## 2023-02-11 DIAGNOSIS — E119 Type 2 diabetes mellitus without complications: Secondary | ICD-10-CM | POA: Diagnosis not present

## 2023-02-11 DIAGNOSIS — E785 Hyperlipidemia, unspecified: Secondary | ICD-10-CM | POA: Diagnosis not present

## 2023-02-11 DIAGNOSIS — K5090 Crohn's disease, unspecified, without complications: Secondary | ICD-10-CM | POA: Diagnosis not present

## 2023-02-11 DIAGNOSIS — N400 Enlarged prostate without lower urinary tract symptoms: Secondary | ICD-10-CM | POA: Diagnosis not present

## 2023-02-11 DIAGNOSIS — J15212 Pneumonia due to Methicillin resistant Staphylococcus aureus: Secondary | ICD-10-CM | POA: Diagnosis not present

## 2023-02-11 DIAGNOSIS — F39 Unspecified mood [affective] disorder: Secondary | ICD-10-CM | POA: Diagnosis not present

## 2023-02-11 DIAGNOSIS — R5381 Other malaise: Secondary | ICD-10-CM | POA: Diagnosis not present

## 2023-02-15 DIAGNOSIS — R000 Tachycardia, unspecified: Secondary | ICD-10-CM | POA: Diagnosis not present

## 2023-02-15 DIAGNOSIS — A0472 Enterocolitis due to Clostridium difficile, not specified as recurrent: Secondary | ICD-10-CM | POA: Diagnosis not present

## 2023-02-17 DIAGNOSIS — L89311 Pressure ulcer of right buttock, stage 1: Secondary | ICD-10-CM | POA: Diagnosis not present

## 2023-02-17 DIAGNOSIS — E785 Hyperlipidemia, unspecified: Secondary | ICD-10-CM | POA: Diagnosis not present

## 2023-02-17 DIAGNOSIS — I10 Essential (primary) hypertension: Secondary | ICD-10-CM | POA: Diagnosis not present

## 2023-02-17 DIAGNOSIS — E0865 Diabetes mellitus due to underlying condition with hyperglycemia: Secondary | ICD-10-CM | POA: Diagnosis not present

## 2023-02-18 DIAGNOSIS — J9610 Chronic respiratory failure, unspecified whether with hypoxia or hypercapnia: Secondary | ICD-10-CM | POA: Diagnosis not present

## 2023-02-18 DIAGNOSIS — J439 Emphysema, unspecified: Secondary | ICD-10-CM | POA: Diagnosis not present

## 2023-02-18 DIAGNOSIS — K50111 Crohn's disease of large intestine with rectal bleeding: Secondary | ICD-10-CM | POA: Diagnosis not present

## 2023-02-18 DIAGNOSIS — C3412 Malignant neoplasm of upper lobe, left bronchus or lung: Secondary | ICD-10-CM | POA: Diagnosis not present

## 2023-02-18 DIAGNOSIS — I10 Essential (primary) hypertension: Secondary | ICD-10-CM | POA: Diagnosis not present

## 2023-02-18 DIAGNOSIS — Z79899 Other long term (current) drug therapy: Secondary | ICD-10-CM | POA: Diagnosis not present

## 2023-02-18 DIAGNOSIS — Z8546 Personal history of malignant neoplasm of prostate: Secondary | ICD-10-CM | POA: Diagnosis not present

## 2023-02-18 DIAGNOSIS — Z510 Encounter for antineoplastic radiation therapy: Secondary | ICD-10-CM | POA: Diagnosis not present

## 2023-02-18 DIAGNOSIS — Z923 Personal history of irradiation: Secondary | ICD-10-CM | POA: Diagnosis not present

## 2023-02-18 DIAGNOSIS — Z87891 Personal history of nicotine dependence: Secondary | ICD-10-CM | POA: Diagnosis not present

## 2023-02-19 DIAGNOSIS — R600 Localized edema: Secondary | ICD-10-CM | POA: Diagnosis not present

## 2023-02-22 DIAGNOSIS — R0989 Other specified symptoms and signs involving the circulatory and respiratory systems: Secondary | ICD-10-CM | POA: Diagnosis not present

## 2023-02-22 DIAGNOSIS — R221 Localized swelling, mass and lump, neck: Secondary | ICD-10-CM | POA: Diagnosis not present

## 2023-02-22 DIAGNOSIS — R059 Cough, unspecified: Secondary | ICD-10-CM | POA: Diagnosis not present

## 2023-02-23 DIAGNOSIS — R000 Tachycardia, unspecified: Secondary | ICD-10-CM | POA: Diagnosis not present

## 2023-02-23 DIAGNOSIS — B49 Unspecified mycosis: Secondary | ICD-10-CM | POA: Diagnosis not present

## 2023-02-23 DIAGNOSIS — J449 Chronic obstructive pulmonary disease, unspecified: Secondary | ICD-10-CM | POA: Diagnosis not present

## 2023-02-23 DIAGNOSIS — I700 Atherosclerosis of aorta: Secondary | ICD-10-CM | POA: Diagnosis not present

## 2023-02-23 DIAGNOSIS — R911 Solitary pulmonary nodule: Secondary | ICD-10-CM | POA: Diagnosis not present

## 2023-02-23 DIAGNOSIS — I2699 Other pulmonary embolism without acute cor pulmonale: Secondary | ICD-10-CM | POA: Diagnosis not present

## 2023-02-23 DIAGNOSIS — K5090 Crohn's disease, unspecified, without complications: Secondary | ICD-10-CM | POA: Diagnosis not present

## 2023-02-23 DIAGNOSIS — E1165 Type 2 diabetes mellitus with hyperglycemia: Secondary | ICD-10-CM | POA: Diagnosis not present

## 2023-02-23 DIAGNOSIS — D509 Iron deficiency anemia, unspecified: Secondary | ICD-10-CM | POA: Diagnosis not present

## 2023-02-23 DIAGNOSIS — J15212 Pneumonia due to Methicillin resistant Staphylococcus aureus: Secondary | ICD-10-CM | POA: Diagnosis not present

## 2023-02-23 DIAGNOSIS — I5032 Chronic diastolic (congestive) heart failure: Secondary | ICD-10-CM | POA: Diagnosis not present

## 2023-02-23 DIAGNOSIS — E440 Moderate protein-calorie malnutrition: Secondary | ICD-10-CM | POA: Diagnosis not present

## 2023-02-24 DIAGNOSIS — R0989 Other specified symptoms and signs involving the circulatory and respiratory systems: Secondary | ICD-10-CM | POA: Diagnosis not present

## 2023-02-24 DIAGNOSIS — I10 Essential (primary) hypertension: Secondary | ICD-10-CM | POA: Diagnosis not present

## 2023-02-24 DIAGNOSIS — E785 Hyperlipidemia, unspecified: Secondary | ICD-10-CM | POA: Diagnosis not present

## 2023-02-24 DIAGNOSIS — C3412 Malignant neoplasm of upper lobe, left bronchus or lung: Secondary | ICD-10-CM | POA: Diagnosis not present

## 2023-02-24 DIAGNOSIS — E0865 Diabetes mellitus due to underlying condition with hyperglycemia: Secondary | ICD-10-CM | POA: Diagnosis not present

## 2023-02-24 DIAGNOSIS — L89311 Pressure ulcer of right buttock, stage 1: Secondary | ICD-10-CM | POA: Diagnosis not present

## 2023-02-24 DIAGNOSIS — Z510 Encounter for antineoplastic radiation therapy: Secondary | ICD-10-CM | POA: Diagnosis not present

## 2023-02-26 DIAGNOSIS — J189 Pneumonia, unspecified organism: Secondary | ICD-10-CM | POA: Diagnosis not present

## 2023-03-01 DIAGNOSIS — J189 Pneumonia, unspecified organism: Secondary | ICD-10-CM | POA: Diagnosis not present

## 2023-03-01 DIAGNOSIS — C3412 Malignant neoplasm of upper lobe, left bronchus or lung: Secondary | ICD-10-CM | POA: Diagnosis not present

## 2023-03-01 DIAGNOSIS — Z510 Encounter for antineoplastic radiation therapy: Secondary | ICD-10-CM | POA: Diagnosis not present

## 2023-03-02 DIAGNOSIS — E119 Type 2 diabetes mellitus without complications: Secondary | ICD-10-CM | POA: Diagnosis not present

## 2023-03-02 DIAGNOSIS — J069 Acute upper respiratory infection, unspecified: Secondary | ICD-10-CM | POA: Diagnosis not present

## 2023-03-02 DIAGNOSIS — R531 Weakness: Secondary | ICD-10-CM | POA: Diagnosis not present

## 2023-03-02 DIAGNOSIS — Z7401 Bed confinement status: Secondary | ICD-10-CM | POA: Diagnosis not present

## 2023-03-02 DIAGNOSIS — I10 Essential (primary) hypertension: Secondary | ICD-10-CM | POA: Diagnosis not present

## 2023-03-02 DIAGNOSIS — Z1152 Encounter for screening for COVID-19: Secondary | ICD-10-CM | POA: Diagnosis not present

## 2023-03-02 DIAGNOSIS — K8020 Calculus of gallbladder without cholecystitis without obstruction: Secondary | ICD-10-CM | POA: Diagnosis not present

## 2023-03-02 DIAGNOSIS — R918 Other nonspecific abnormal finding of lung field: Secondary | ICD-10-CM | POA: Diagnosis not present

## 2023-03-02 DIAGNOSIS — Z794 Long term (current) use of insulin: Secondary | ICD-10-CM | POA: Diagnosis not present

## 2023-03-02 DIAGNOSIS — Z01818 Encounter for other preprocedural examination: Secondary | ICD-10-CM | POA: Diagnosis not present

## 2023-03-02 DIAGNOSIS — Z20822 Contact with and (suspected) exposure to covid-19: Secondary | ICD-10-CM | POA: Diagnosis not present

## 2023-03-02 DIAGNOSIS — J449 Chronic obstructive pulmonary disease, unspecified: Secondary | ICD-10-CM | POA: Diagnosis not present

## 2023-03-02 DIAGNOSIS — J441 Chronic obstructive pulmonary disease with (acute) exacerbation: Secondary | ICD-10-CM | POA: Diagnosis not present

## 2023-03-02 DIAGNOSIS — Z86711 Personal history of pulmonary embolism: Secondary | ICD-10-CM | POA: Diagnosis not present

## 2023-03-02 DIAGNOSIS — Z9981 Dependence on supplemental oxygen: Secondary | ICD-10-CM | POA: Diagnosis not present

## 2023-03-02 DIAGNOSIS — M799 Soft tissue disorder, unspecified: Secondary | ICD-10-CM | POA: Diagnosis not present

## 2023-03-02 DIAGNOSIS — R0602 Shortness of breath: Secondary | ICD-10-CM | POA: Diagnosis not present

## 2023-03-02 DIAGNOSIS — R000 Tachycardia, unspecified: Secondary | ICD-10-CM | POA: Diagnosis not present

## 2023-03-03 DIAGNOSIS — L89311 Pressure ulcer of right buttock, stage 1: Secondary | ICD-10-CM | POA: Diagnosis not present

## 2023-03-03 DIAGNOSIS — R000 Tachycardia, unspecified: Secondary | ICD-10-CM | POA: Diagnosis not present

## 2023-03-03 DIAGNOSIS — E785 Hyperlipidemia, unspecified: Secondary | ICD-10-CM | POA: Diagnosis not present

## 2023-03-03 DIAGNOSIS — R0602 Shortness of breath: Secondary | ICD-10-CM | POA: Diagnosis not present

## 2023-03-03 DIAGNOSIS — E0865 Diabetes mellitus due to underlying condition with hyperglycemia: Secondary | ICD-10-CM | POA: Diagnosis not present

## 2023-03-03 DIAGNOSIS — I10 Essential (primary) hypertension: Secondary | ICD-10-CM | POA: Diagnosis not present

## 2023-03-03 DIAGNOSIS — J189 Pneumonia, unspecified organism: Secondary | ICD-10-CM | POA: Diagnosis not present

## 2023-03-05 DIAGNOSIS — B369 Superficial mycosis, unspecified: Secondary | ICD-10-CM | POA: Diagnosis not present

## 2023-03-08 DIAGNOSIS — K5090 Crohn's disease, unspecified, without complications: Secondary | ICD-10-CM | POA: Diagnosis not present

## 2023-03-08 DIAGNOSIS — J449 Chronic obstructive pulmonary disease, unspecified: Secondary | ICD-10-CM | POA: Diagnosis not present

## 2023-03-08 DIAGNOSIS — E119 Type 2 diabetes mellitus without complications: Secondary | ICD-10-CM | POA: Diagnosis not present

## 2023-03-08 DIAGNOSIS — E785 Hyperlipidemia, unspecified: Secondary | ICD-10-CM | POA: Diagnosis not present

## 2023-03-09 DIAGNOSIS — J189 Pneumonia, unspecified organism: Secondary | ICD-10-CM | POA: Diagnosis not present

## 2023-03-09 DIAGNOSIS — Z510 Encounter for antineoplastic radiation therapy: Secondary | ICD-10-CM | POA: Diagnosis not present

## 2023-03-09 DIAGNOSIS — C3412 Malignant neoplasm of upper lobe, left bronchus or lung: Secondary | ICD-10-CM | POA: Diagnosis not present

## 2023-03-10 DIAGNOSIS — R000 Tachycardia, unspecified: Secondary | ICD-10-CM | POA: Diagnosis not present

## 2023-03-10 DIAGNOSIS — Z5189 Encounter for other specified aftercare: Secondary | ICD-10-CM | POA: Diagnosis not present

## 2023-03-10 DIAGNOSIS — J449 Chronic obstructive pulmonary disease, unspecified: Secondary | ICD-10-CM | POA: Diagnosis not present

## 2023-03-11 DIAGNOSIS — Z510 Encounter for antineoplastic radiation therapy: Secondary | ICD-10-CM | POA: Diagnosis not present

## 2023-03-11 DIAGNOSIS — Z9189 Other specified personal risk factors, not elsewhere classified: Secondary | ICD-10-CM | POA: Diagnosis not present

## 2023-03-11 DIAGNOSIS — J189 Pneumonia, unspecified organism: Secondary | ICD-10-CM | POA: Diagnosis not present

## 2023-03-11 DIAGNOSIS — C3412 Malignant neoplasm of upper lobe, left bronchus or lung: Secondary | ICD-10-CM | POA: Diagnosis not present

## 2023-03-11 DIAGNOSIS — R197 Diarrhea, unspecified: Secondary | ICD-10-CM | POA: Diagnosis not present

## 2023-03-15 DIAGNOSIS — R197 Diarrhea, unspecified: Secondary | ICD-10-CM | POA: Diagnosis not present

## 2023-03-16 DIAGNOSIS — Z9981 Dependence on supplemental oxygen: Secondary | ICD-10-CM | POA: Diagnosis not present

## 2023-03-16 DIAGNOSIS — C3412 Malignant neoplasm of upper lobe, left bronchus or lung: Secondary | ICD-10-CM | POA: Diagnosis not present

## 2023-03-16 DIAGNOSIS — J988 Other specified respiratory disorders: Secondary | ICD-10-CM | POA: Diagnosis not present

## 2023-03-16 DIAGNOSIS — J189 Pneumonia, unspecified organism: Secondary | ICD-10-CM | POA: Diagnosis not present

## 2023-03-16 DIAGNOSIS — J3801 Paralysis of vocal cords and larynx, unilateral: Secondary | ICD-10-CM | POA: Diagnosis not present

## 2023-03-16 DIAGNOSIS — R000 Tachycardia, unspecified: Secondary | ICD-10-CM | POA: Diagnosis not present

## 2023-03-16 DIAGNOSIS — R0602 Shortness of breath: Secondary | ICD-10-CM | POA: Diagnosis not present

## 2023-03-16 DIAGNOSIS — Z510 Encounter for antineoplastic radiation therapy: Secondary | ICD-10-CM | POA: Diagnosis not present

## 2023-03-18 DIAGNOSIS — I5032 Chronic diastolic (congestive) heart failure: Secondary | ICD-10-CM | POA: Diagnosis not present

## 2023-03-18 DIAGNOSIS — J988 Other specified respiratory disorders: Secondary | ICD-10-CM | POA: Diagnosis not present

## 2023-03-18 DIAGNOSIS — I2699 Other pulmonary embolism without acute cor pulmonale: Secondary | ICD-10-CM | POA: Diagnosis not present

## 2023-03-18 DIAGNOSIS — R531 Weakness: Secondary | ICD-10-CM | POA: Diagnosis not present

## 2023-03-18 DIAGNOSIS — E1165 Type 2 diabetes mellitus with hyperglycemia: Secondary | ICD-10-CM | POA: Diagnosis not present

## 2023-03-18 DIAGNOSIS — R000 Tachycardia, unspecified: Secondary | ICD-10-CM | POA: Diagnosis not present

## 2023-03-19 DIAGNOSIS — Z510 Encounter for antineoplastic radiation therapy: Secondary | ICD-10-CM | POA: Diagnosis not present

## 2023-03-19 DIAGNOSIS — J189 Pneumonia, unspecified organism: Secondary | ICD-10-CM | POA: Diagnosis not present

## 2023-03-19 DIAGNOSIS — C3412 Malignant neoplasm of upper lobe, left bronchus or lung: Secondary | ICD-10-CM | POA: Diagnosis not present

## 2023-03-23 DIAGNOSIS — Z87891 Personal history of nicotine dependence: Secondary | ICD-10-CM | POA: Diagnosis not present

## 2023-03-23 DIAGNOSIS — R911 Solitary pulmonary nodule: Secondary | ICD-10-CM | POA: Diagnosis not present

## 2023-03-23 DIAGNOSIS — J189 Pneumonia, unspecified organism: Secondary | ICD-10-CM | POA: Diagnosis not present

## 2023-03-23 DIAGNOSIS — J449 Chronic obstructive pulmonary disease, unspecified: Secondary | ICD-10-CM | POA: Diagnosis not present

## 2023-03-23 DIAGNOSIS — Z510 Encounter for antineoplastic radiation therapy: Secondary | ICD-10-CM | POA: Diagnosis not present

## 2023-03-23 DIAGNOSIS — C3412 Malignant neoplasm of upper lobe, left bronchus or lung: Secondary | ICD-10-CM | POA: Diagnosis not present

## 2023-03-24 DIAGNOSIS — R0602 Shortness of breath: Secondary | ICD-10-CM | POA: Diagnosis not present

## 2023-03-24 DIAGNOSIS — R531 Weakness: Secondary | ICD-10-CM | POA: Diagnosis not present

## 2023-03-24 DIAGNOSIS — R000 Tachycardia, unspecified: Secondary | ICD-10-CM | POA: Diagnosis not present

## 2023-03-24 DIAGNOSIS — R6889 Other general symptoms and signs: Secondary | ICD-10-CM | POA: Diagnosis not present

## 2023-03-31 DIAGNOSIS — J988 Other specified respiratory disorders: Secondary | ICD-10-CM | POA: Diagnosis not present

## 2023-03-31 DIAGNOSIS — R531 Weakness: Secondary | ICD-10-CM | POA: Diagnosis not present

## 2023-03-31 DIAGNOSIS — R6889 Other general symptoms and signs: Secondary | ICD-10-CM | POA: Diagnosis not present

## 2023-04-01 DIAGNOSIS — R6889 Other general symptoms and signs: Secondary | ICD-10-CM | POA: Diagnosis not present

## 2023-04-01 DIAGNOSIS — J988 Other specified respiratory disorders: Secondary | ICD-10-CM | POA: Diagnosis not present

## 2023-04-01 DIAGNOSIS — R531 Weakness: Secondary | ICD-10-CM | POA: Diagnosis not present

## 2023-04-01 DIAGNOSIS — I2699 Other pulmonary embolism without acute cor pulmonale: Secondary | ICD-10-CM | POA: Diagnosis not present

## 2023-04-06 DIAGNOSIS — J441 Chronic obstructive pulmonary disease with (acute) exacerbation: Secondary | ICD-10-CM | POA: Diagnosis not present

## 2023-04-06 DIAGNOSIS — J440 Chronic obstructive pulmonary disease with acute lower respiratory infection: Secondary | ICD-10-CM | POA: Diagnosis not present

## 2023-04-06 DIAGNOSIS — I10 Essential (primary) hypertension: Secondary | ICD-10-CM | POA: Diagnosis not present

## 2023-04-06 DIAGNOSIS — D509 Iron deficiency anemia, unspecified: Secondary | ICD-10-CM | POA: Diagnosis not present

## 2023-04-06 DIAGNOSIS — J42 Unspecified chronic bronchitis: Secondary | ICD-10-CM | POA: Diagnosis not present

## 2023-04-06 DIAGNOSIS — J9611 Chronic respiratory failure with hypoxia: Secondary | ICD-10-CM | POA: Diagnosis not present

## 2023-04-06 DIAGNOSIS — J15212 Pneumonia due to Methicillin resistant Staphylococcus aureus: Secondary | ICD-10-CM | POA: Diagnosis not present

## 2023-04-06 DIAGNOSIS — K5090 Crohn's disease, unspecified, without complications: Secondary | ICD-10-CM | POA: Diagnosis not present

## 2023-04-07 DIAGNOSIS — R0602 Shortness of breath: Secondary | ICD-10-CM | POA: Diagnosis not present

## 2023-04-07 DIAGNOSIS — R6889 Other general symptoms and signs: Secondary | ICD-10-CM | POA: Diagnosis not present

## 2023-04-07 DIAGNOSIS — R000 Tachycardia, unspecified: Secondary | ICD-10-CM | POA: Diagnosis not present

## 2023-04-14 DIAGNOSIS — R000 Tachycardia, unspecified: Secondary | ICD-10-CM | POA: Diagnosis not present

## 2023-04-14 DIAGNOSIS — R531 Weakness: Secondary | ICD-10-CM | POA: Diagnosis not present

## 2023-04-14 DIAGNOSIS — R0602 Shortness of breath: Secondary | ICD-10-CM | POA: Diagnosis not present

## 2023-04-15 DIAGNOSIS — R000 Tachycardia, unspecified: Secondary | ICD-10-CM | POA: Diagnosis not present

## 2023-04-15 DIAGNOSIS — E119 Type 2 diabetes mellitus without complications: Secondary | ICD-10-CM | POA: Diagnosis not present

## 2023-04-15 DIAGNOSIS — R531 Weakness: Secondary | ICD-10-CM | POA: Diagnosis not present

## 2023-04-15 DIAGNOSIS — J449 Chronic obstructive pulmonary disease, unspecified: Secondary | ICD-10-CM | POA: Diagnosis not present

## 2023-04-19 DIAGNOSIS — J988 Other specified respiratory disorders: Secondary | ICD-10-CM | POA: Diagnosis not present

## 2023-04-19 DIAGNOSIS — J449 Chronic obstructive pulmonary disease, unspecified: Secondary | ICD-10-CM | POA: Diagnosis not present

## 2023-04-19 DIAGNOSIS — R059 Cough, unspecified: Secondary | ICD-10-CM | POA: Diagnosis not present

## 2023-04-26 DIAGNOSIS — R109 Unspecified abdominal pain: Secondary | ICD-10-CM | POA: Diagnosis not present

## 2023-04-27 DIAGNOSIS — K5641 Fecal impaction: Secondary | ICD-10-CM | POA: Diagnosis not present

## 2023-04-28 DIAGNOSIS — R109 Unspecified abdominal pain: Secondary | ICD-10-CM | POA: Diagnosis not present

## 2023-05-03 DIAGNOSIS — R296 Repeated falls: Secondary | ICD-10-CM | POA: Diagnosis not present

## 2023-05-10 DIAGNOSIS — K5090 Crohn's disease, unspecified, without complications: Secondary | ICD-10-CM | POA: Diagnosis not present

## 2023-05-10 DIAGNOSIS — J988 Other specified respiratory disorders: Secondary | ICD-10-CM | POA: Diagnosis not present

## 2023-05-10 DIAGNOSIS — E785 Hyperlipidemia, unspecified: Secondary | ICD-10-CM | POA: Diagnosis not present

## 2023-05-10 DIAGNOSIS — R109 Unspecified abdominal pain: Secondary | ICD-10-CM | POA: Diagnosis not present

## 2023-05-10 DIAGNOSIS — R6889 Other general symptoms and signs: Secondary | ICD-10-CM | POA: Diagnosis not present

## 2023-05-10 DIAGNOSIS — E119 Type 2 diabetes mellitus without complications: Secondary | ICD-10-CM | POA: Diagnosis not present

## 2023-05-10 DIAGNOSIS — R221 Localized swelling, mass and lump, neck: Secondary | ICD-10-CM | POA: Diagnosis not present

## 2023-05-10 DIAGNOSIS — N400 Enlarged prostate without lower urinary tract symptoms: Secondary | ICD-10-CM | POA: Diagnosis not present

## 2023-05-10 DIAGNOSIS — I2699 Other pulmonary embolism without acute cor pulmonale: Secondary | ICD-10-CM | POA: Diagnosis not present

## 2023-05-10 DIAGNOSIS — R531 Weakness: Secondary | ICD-10-CM | POA: Diagnosis not present

## 2023-05-10 DIAGNOSIS — J449 Chronic obstructive pulmonary disease, unspecified: Secondary | ICD-10-CM | POA: Diagnosis not present

## 2023-05-11 DIAGNOSIS — Z8701 Personal history of pneumonia (recurrent): Secondary | ICD-10-CM | POA: Diagnosis not present

## 2023-05-11 DIAGNOSIS — M6284 Sarcopenia: Secondary | ICD-10-CM | POA: Diagnosis not present

## 2023-05-11 DIAGNOSIS — I10 Essential (primary) hypertension: Secondary | ICD-10-CM | POA: Diagnosis not present

## 2023-05-11 DIAGNOSIS — N400 Enlarged prostate without lower urinary tract symptoms: Secondary | ICD-10-CM | POA: Diagnosis not present

## 2023-05-11 DIAGNOSIS — D696 Thrombocytopenia, unspecified: Secondary | ICD-10-CM | POA: Diagnosis not present

## 2023-05-11 DIAGNOSIS — R100 Acute abdomen: Secondary | ICD-10-CM | POA: Diagnosis not present

## 2023-05-11 DIAGNOSIS — E119 Type 2 diabetes mellitus without complications: Secondary | ICD-10-CM | POA: Diagnosis not present

## 2023-05-11 DIAGNOSIS — E785 Hyperlipidemia, unspecified: Secondary | ICD-10-CM | POA: Diagnosis not present

## 2023-05-11 DIAGNOSIS — K5010 Crohn's disease of large intestine without complications: Secondary | ICD-10-CM | POA: Diagnosis not present

## 2023-05-11 DIAGNOSIS — Z794 Long term (current) use of insulin: Secondary | ICD-10-CM | POA: Diagnosis not present

## 2023-05-11 DIAGNOSIS — E559 Vitamin D deficiency, unspecified: Secondary | ICD-10-CM | POA: Diagnosis not present

## 2023-05-11 DIAGNOSIS — Z9181 History of falling: Secondary | ICD-10-CM | POA: Diagnosis not present

## 2023-05-11 DIAGNOSIS — Z87891 Personal history of nicotine dependence: Secondary | ICD-10-CM | POA: Diagnosis not present

## 2023-05-11 DIAGNOSIS — K219 Gastro-esophageal reflux disease without esophagitis: Secondary | ICD-10-CM | POA: Diagnosis not present

## 2023-05-11 DIAGNOSIS — K5090 Crohn's disease, unspecified, without complications: Secondary | ICD-10-CM | POA: Diagnosis not present

## 2023-05-11 DIAGNOSIS — J449 Chronic obstructive pulmonary disease, unspecified: Secondary | ICD-10-CM | POA: Diagnosis not present

## 2023-05-11 DIAGNOSIS — I743 Embolism and thrombosis of arteries of the lower extremities: Secondary | ICD-10-CM | POA: Diagnosis not present

## 2023-05-12 DIAGNOSIS — Z7902 Long term (current) use of antithrombotics/antiplatelets: Secondary | ICD-10-CM | POA: Diagnosis not present

## 2023-05-12 DIAGNOSIS — I10 Essential (primary) hypertension: Secondary | ICD-10-CM | POA: Diagnosis not present

## 2023-05-12 DIAGNOSIS — J439 Emphysema, unspecified: Secondary | ICD-10-CM | POA: Diagnosis not present

## 2023-05-12 DIAGNOSIS — Z7983 Long term (current) use of bisphosphonates: Secondary | ICD-10-CM | POA: Diagnosis not present

## 2023-05-12 DIAGNOSIS — Z79899 Other long term (current) drug therapy: Secondary | ICD-10-CM | POA: Diagnosis not present

## 2023-05-12 DIAGNOSIS — Z8616 Personal history of COVID-19: Secondary | ICD-10-CM | POA: Diagnosis not present

## 2023-05-12 DIAGNOSIS — Z87891 Personal history of nicotine dependence: Secondary | ICD-10-CM | POA: Diagnosis not present

## 2023-05-12 DIAGNOSIS — K219 Gastro-esophageal reflux disease without esophagitis: Secondary | ICD-10-CM | POA: Diagnosis not present

## 2023-05-12 DIAGNOSIS — J449 Chronic obstructive pulmonary disease, unspecified: Secondary | ICD-10-CM | POA: Diagnosis not present

## 2023-05-12 DIAGNOSIS — D472 Monoclonal gammopathy: Secondary | ICD-10-CM | POA: Diagnosis not present

## 2023-05-13 DIAGNOSIS — E785 Hyperlipidemia, unspecified: Secondary | ICD-10-CM | POA: Diagnosis not present

## 2023-05-13 DIAGNOSIS — E119 Type 2 diabetes mellitus without complications: Secondary | ICD-10-CM | POA: Diagnosis not present

## 2023-05-13 DIAGNOSIS — I10 Essential (primary) hypertension: Secondary | ICD-10-CM | POA: Diagnosis not present

## 2023-05-13 DIAGNOSIS — I743 Embolism and thrombosis of arteries of the lower extremities: Secondary | ICD-10-CM | POA: Diagnosis not present

## 2023-05-13 DIAGNOSIS — M6284 Sarcopenia: Secondary | ICD-10-CM | POA: Diagnosis not present

## 2023-05-13 DIAGNOSIS — J449 Chronic obstructive pulmonary disease, unspecified: Secondary | ICD-10-CM | POA: Diagnosis not present

## 2023-05-14 DIAGNOSIS — Z8616 Personal history of COVID-19: Secondary | ICD-10-CM | POA: Diagnosis not present

## 2023-05-14 DIAGNOSIS — R771 Abnormality of globulin: Secondary | ICD-10-CM | POA: Diagnosis not present

## 2023-05-14 DIAGNOSIS — K219 Gastro-esophageal reflux disease without esophagitis: Secondary | ICD-10-CM | POA: Diagnosis not present

## 2023-05-14 DIAGNOSIS — E785 Hyperlipidemia, unspecified: Secondary | ICD-10-CM | POA: Diagnosis not present

## 2023-05-14 DIAGNOSIS — I10 Essential (primary) hypertension: Secondary | ICD-10-CM | POA: Diagnosis not present

## 2023-05-14 DIAGNOSIS — J449 Chronic obstructive pulmonary disease, unspecified: Secondary | ICD-10-CM | POA: Diagnosis not present

## 2023-05-14 DIAGNOSIS — M6284 Sarcopenia: Secondary | ICD-10-CM | POA: Diagnosis not present

## 2023-05-14 DIAGNOSIS — D472 Monoclonal gammopathy: Secondary | ICD-10-CM | POA: Diagnosis not present

## 2023-05-14 DIAGNOSIS — J439 Emphysema, unspecified: Secondary | ICD-10-CM | POA: Diagnosis not present

## 2023-05-14 DIAGNOSIS — E119 Type 2 diabetes mellitus without complications: Secondary | ICD-10-CM | POA: Diagnosis not present

## 2023-05-14 DIAGNOSIS — I743 Embolism and thrombosis of arteries of the lower extremities: Secondary | ICD-10-CM | POA: Diagnosis not present

## 2023-05-17 DIAGNOSIS — K2100 Gastro-esophageal reflux disease with esophagitis, without bleeding: Secondary | ICD-10-CM | POA: Diagnosis not present

## 2023-05-17 DIAGNOSIS — K5010 Crohn's disease of large intestine without complications: Secondary | ICD-10-CM | POA: Diagnosis not present

## 2023-05-17 DIAGNOSIS — R197 Diarrhea, unspecified: Secondary | ICD-10-CM | POA: Diagnosis not present

## 2023-05-18 DIAGNOSIS — I743 Embolism and thrombosis of arteries of the lower extremities: Secondary | ICD-10-CM | POA: Diagnosis not present

## 2023-05-18 DIAGNOSIS — J449 Chronic obstructive pulmonary disease, unspecified: Secondary | ICD-10-CM | POA: Diagnosis not present

## 2023-05-18 DIAGNOSIS — E785 Hyperlipidemia, unspecified: Secondary | ICD-10-CM | POA: Diagnosis not present

## 2023-05-18 DIAGNOSIS — I10 Essential (primary) hypertension: Secondary | ICD-10-CM | POA: Diagnosis not present

## 2023-05-18 DIAGNOSIS — M6284 Sarcopenia: Secondary | ICD-10-CM | POA: Diagnosis not present

## 2023-05-18 DIAGNOSIS — E119 Type 2 diabetes mellitus without complications: Secondary | ICD-10-CM | POA: Diagnosis not present

## 2023-05-21 DIAGNOSIS — I10 Essential (primary) hypertension: Secondary | ICD-10-CM | POA: Diagnosis not present

## 2023-05-21 DIAGNOSIS — I743 Embolism and thrombosis of arteries of the lower extremities: Secondary | ICD-10-CM | POA: Diagnosis not present

## 2023-05-21 DIAGNOSIS — E119 Type 2 diabetes mellitus without complications: Secondary | ICD-10-CM | POA: Diagnosis not present

## 2023-05-21 DIAGNOSIS — M6284 Sarcopenia: Secondary | ICD-10-CM | POA: Diagnosis not present

## 2023-05-21 DIAGNOSIS — E785 Hyperlipidemia, unspecified: Secondary | ICD-10-CM | POA: Diagnosis not present

## 2023-05-21 DIAGNOSIS — J449 Chronic obstructive pulmonary disease, unspecified: Secondary | ICD-10-CM | POA: Diagnosis not present

## 2023-05-24 DIAGNOSIS — I10 Essential (primary) hypertension: Secondary | ICD-10-CM | POA: Diagnosis not present

## 2023-05-24 DIAGNOSIS — E119 Type 2 diabetes mellitus without complications: Secondary | ICD-10-CM | POA: Diagnosis not present

## 2023-05-24 DIAGNOSIS — E785 Hyperlipidemia, unspecified: Secondary | ICD-10-CM | POA: Diagnosis not present

## 2023-05-24 DIAGNOSIS — J449 Chronic obstructive pulmonary disease, unspecified: Secondary | ICD-10-CM | POA: Diagnosis not present

## 2023-05-24 DIAGNOSIS — I743 Embolism and thrombosis of arteries of the lower extremities: Secondary | ICD-10-CM | POA: Diagnosis not present

## 2023-05-24 DIAGNOSIS — M6284 Sarcopenia: Secondary | ICD-10-CM | POA: Diagnosis not present

## 2023-05-25 DIAGNOSIS — K5010 Crohn's disease of large intestine without complications: Secondary | ICD-10-CM | POA: Diagnosis not present

## 2023-05-27 DIAGNOSIS — Z23 Encounter for immunization: Secondary | ICD-10-CM | POA: Diagnosis not present

## 2023-05-27 DIAGNOSIS — I2699 Other pulmonary embolism without acute cor pulmonale: Secondary | ICD-10-CM | POA: Diagnosis not present

## 2023-05-27 DIAGNOSIS — Z1329 Encounter for screening for other suspected endocrine disorder: Secondary | ICD-10-CM | POA: Diagnosis not present

## 2023-05-27 DIAGNOSIS — S32000A Wedge compression fracture of unspecified lumbar vertebra, initial encounter for closed fracture: Secondary | ICD-10-CM | POA: Diagnosis not present

## 2023-05-27 DIAGNOSIS — S22080A Wedge compression fracture of T11-T12 vertebra, initial encounter for closed fracture: Secondary | ICD-10-CM | POA: Diagnosis not present

## 2023-05-27 DIAGNOSIS — Z1321 Encounter for screening for nutritional disorder: Secondary | ICD-10-CM | POA: Diagnosis not present

## 2023-05-27 DIAGNOSIS — E1165 Type 2 diabetes mellitus with hyperglycemia: Secondary | ICD-10-CM | POA: Diagnosis not present

## 2023-05-27 DIAGNOSIS — K50919 Crohn's disease, unspecified, with unspecified complications: Secondary | ICD-10-CM | POA: Diagnosis not present

## 2023-05-27 DIAGNOSIS — I700 Atherosclerosis of aorta: Secondary | ICD-10-CM | POA: Diagnosis not present

## 2023-05-27 DIAGNOSIS — E440 Moderate protein-calorie malnutrition: Secondary | ICD-10-CM | POA: Diagnosis not present

## 2023-05-27 DIAGNOSIS — R911 Solitary pulmonary nodule: Secondary | ICD-10-CM | POA: Diagnosis not present

## 2023-05-27 DIAGNOSIS — J9611 Chronic respiratory failure with hypoxia: Secondary | ICD-10-CM | POA: Diagnosis not present

## 2023-05-28 DIAGNOSIS — J449 Chronic obstructive pulmonary disease, unspecified: Secondary | ICD-10-CM | POA: Diagnosis not present

## 2023-05-28 DIAGNOSIS — I10 Essential (primary) hypertension: Secondary | ICD-10-CM | POA: Diagnosis not present

## 2023-05-28 DIAGNOSIS — E119 Type 2 diabetes mellitus without complications: Secondary | ICD-10-CM | POA: Diagnosis not present

## 2023-05-28 DIAGNOSIS — M6284 Sarcopenia: Secondary | ICD-10-CM | POA: Diagnosis not present

## 2023-05-28 DIAGNOSIS — E785 Hyperlipidemia, unspecified: Secondary | ICD-10-CM | POA: Diagnosis not present

## 2023-05-28 DIAGNOSIS — I743 Embolism and thrombosis of arteries of the lower extremities: Secondary | ICD-10-CM | POA: Diagnosis not present

## 2023-06-02 DIAGNOSIS — E119 Type 2 diabetes mellitus without complications: Secondary | ICD-10-CM | POA: Diagnosis not present

## 2023-06-02 DIAGNOSIS — E785 Hyperlipidemia, unspecified: Secondary | ICD-10-CM | POA: Diagnosis not present

## 2023-06-02 DIAGNOSIS — I743 Embolism and thrombosis of arteries of the lower extremities: Secondary | ICD-10-CM | POA: Diagnosis not present

## 2023-06-02 DIAGNOSIS — M6284 Sarcopenia: Secondary | ICD-10-CM | POA: Diagnosis not present

## 2023-06-02 DIAGNOSIS — J449 Chronic obstructive pulmonary disease, unspecified: Secondary | ICD-10-CM | POA: Diagnosis not present

## 2023-06-02 DIAGNOSIS — I10 Essential (primary) hypertension: Secondary | ICD-10-CM | POA: Diagnosis not present

## 2023-06-03 DIAGNOSIS — C44222 Squamous cell carcinoma of skin of right ear and external auricular canal: Secondary | ICD-10-CM | POA: Diagnosis not present

## 2023-06-03 DIAGNOSIS — E785 Hyperlipidemia, unspecified: Secondary | ICD-10-CM | POA: Diagnosis not present

## 2023-06-03 DIAGNOSIS — J449 Chronic obstructive pulmonary disease, unspecified: Secondary | ICD-10-CM | POA: Diagnosis not present

## 2023-06-03 DIAGNOSIS — I743 Embolism and thrombosis of arteries of the lower extremities: Secondary | ICD-10-CM | POA: Diagnosis not present

## 2023-06-03 DIAGNOSIS — I10 Essential (primary) hypertension: Secondary | ICD-10-CM | POA: Diagnosis not present

## 2023-06-03 DIAGNOSIS — D696 Thrombocytopenia, unspecified: Secondary | ICD-10-CM | POA: Diagnosis not present

## 2023-06-03 DIAGNOSIS — E559 Vitamin D deficiency, unspecified: Secondary | ICD-10-CM | POA: Diagnosis not present

## 2023-06-03 DIAGNOSIS — Z08 Encounter for follow-up examination after completed treatment for malignant neoplasm: Secondary | ICD-10-CM | POA: Diagnosis not present

## 2023-06-03 DIAGNOSIS — M6284 Sarcopenia: Secondary | ICD-10-CM | POA: Diagnosis not present

## 2023-06-03 DIAGNOSIS — C44622 Squamous cell carcinoma of skin of right upper limb, including shoulder: Secondary | ICD-10-CM | POA: Diagnosis not present

## 2023-06-03 DIAGNOSIS — D485 Neoplasm of uncertain behavior of skin: Secondary | ICD-10-CM | POA: Diagnosis not present

## 2023-06-03 DIAGNOSIS — Z85828 Personal history of other malignant neoplasm of skin: Secondary | ICD-10-CM | POA: Diagnosis not present

## 2023-06-03 DIAGNOSIS — E119 Type 2 diabetes mellitus without complications: Secondary | ICD-10-CM | POA: Diagnosis not present

## 2023-06-04 DIAGNOSIS — M6284 Sarcopenia: Secondary | ICD-10-CM | POA: Diagnosis not present

## 2023-06-04 DIAGNOSIS — E785 Hyperlipidemia, unspecified: Secondary | ICD-10-CM | POA: Diagnosis not present

## 2023-06-04 DIAGNOSIS — I743 Embolism and thrombosis of arteries of the lower extremities: Secondary | ICD-10-CM | POA: Diagnosis not present

## 2023-06-04 DIAGNOSIS — E119 Type 2 diabetes mellitus without complications: Secondary | ICD-10-CM | POA: Diagnosis not present

## 2023-06-04 DIAGNOSIS — I10 Essential (primary) hypertension: Secondary | ICD-10-CM | POA: Diagnosis not present

## 2023-06-04 DIAGNOSIS — Z87891 Personal history of nicotine dependence: Secondary | ICD-10-CM | POA: Diagnosis not present

## 2023-06-04 DIAGNOSIS — J449 Chronic obstructive pulmonary disease, unspecified: Secondary | ICD-10-CM | POA: Diagnosis not present

## 2023-06-04 DIAGNOSIS — R911 Solitary pulmonary nodule: Secondary | ICD-10-CM | POA: Diagnosis not present

## 2023-06-08 DIAGNOSIS — I743 Embolism and thrombosis of arteries of the lower extremities: Secondary | ICD-10-CM | POA: Diagnosis not present

## 2023-06-08 DIAGNOSIS — I10 Essential (primary) hypertension: Secondary | ICD-10-CM | POA: Diagnosis not present

## 2023-06-08 DIAGNOSIS — J449 Chronic obstructive pulmonary disease, unspecified: Secondary | ICD-10-CM | POA: Diagnosis not present

## 2023-06-08 DIAGNOSIS — M6284 Sarcopenia: Secondary | ICD-10-CM | POA: Diagnosis not present

## 2023-06-08 DIAGNOSIS — E119 Type 2 diabetes mellitus without complications: Secondary | ICD-10-CM | POA: Diagnosis not present

## 2023-06-08 DIAGNOSIS — E785 Hyperlipidemia, unspecified: Secondary | ICD-10-CM | POA: Diagnosis not present

## 2023-06-10 DIAGNOSIS — E785 Hyperlipidemia, unspecified: Secondary | ICD-10-CM | POA: Diagnosis not present

## 2023-06-10 DIAGNOSIS — I10 Essential (primary) hypertension: Secondary | ICD-10-CM | POA: Diagnosis not present

## 2023-06-10 DIAGNOSIS — Z9181 History of falling: Secondary | ICD-10-CM | POA: Diagnosis not present

## 2023-06-10 DIAGNOSIS — D696 Thrombocytopenia, unspecified: Secondary | ICD-10-CM | POA: Diagnosis not present

## 2023-06-10 DIAGNOSIS — Z87891 Personal history of nicotine dependence: Secondary | ICD-10-CM | POA: Diagnosis not present

## 2023-06-10 DIAGNOSIS — E559 Vitamin D deficiency, unspecified: Secondary | ICD-10-CM | POA: Diagnosis not present

## 2023-06-10 DIAGNOSIS — E119 Type 2 diabetes mellitus without complications: Secondary | ICD-10-CM | POA: Diagnosis not present

## 2023-06-10 DIAGNOSIS — Z794 Long term (current) use of insulin: Secondary | ICD-10-CM | POA: Diagnosis not present

## 2023-06-10 DIAGNOSIS — M6284 Sarcopenia: Secondary | ICD-10-CM | POA: Diagnosis not present

## 2023-06-10 DIAGNOSIS — K5090 Crohn's disease, unspecified, without complications: Secondary | ICD-10-CM | POA: Diagnosis not present

## 2023-06-10 DIAGNOSIS — N400 Enlarged prostate without lower urinary tract symptoms: Secondary | ICD-10-CM | POA: Diagnosis not present

## 2023-06-10 DIAGNOSIS — Z8701 Personal history of pneumonia (recurrent): Secondary | ICD-10-CM | POA: Diagnosis not present

## 2023-06-10 DIAGNOSIS — J449 Chronic obstructive pulmonary disease, unspecified: Secondary | ICD-10-CM | POA: Diagnosis not present

## 2023-06-10 DIAGNOSIS — I743 Embolism and thrombosis of arteries of the lower extremities: Secondary | ICD-10-CM | POA: Diagnosis not present

## 2023-06-10 DIAGNOSIS — K219 Gastro-esophageal reflux disease without esophagitis: Secondary | ICD-10-CM | POA: Diagnosis not present

## 2023-06-11 DIAGNOSIS — E119 Type 2 diabetes mellitus without complications: Secondary | ICD-10-CM | POA: Diagnosis not present

## 2023-06-11 DIAGNOSIS — I743 Embolism and thrombosis of arteries of the lower extremities: Secondary | ICD-10-CM | POA: Diagnosis not present

## 2023-06-11 DIAGNOSIS — E785 Hyperlipidemia, unspecified: Secondary | ICD-10-CM | POA: Diagnosis not present

## 2023-06-11 DIAGNOSIS — J449 Chronic obstructive pulmonary disease, unspecified: Secondary | ICD-10-CM | POA: Diagnosis not present

## 2023-06-11 DIAGNOSIS — M6284 Sarcopenia: Secondary | ICD-10-CM | POA: Diagnosis not present

## 2023-06-11 DIAGNOSIS — I10 Essential (primary) hypertension: Secondary | ICD-10-CM | POA: Diagnosis not present

## 2023-06-13 DIAGNOSIS — J449 Chronic obstructive pulmonary disease, unspecified: Secondary | ICD-10-CM | POA: Diagnosis not present

## 2023-06-13 DIAGNOSIS — E785 Hyperlipidemia, unspecified: Secondary | ICD-10-CM | POA: Diagnosis not present

## 2023-06-13 DIAGNOSIS — M6284 Sarcopenia: Secondary | ICD-10-CM | POA: Diagnosis not present

## 2023-06-13 DIAGNOSIS — E119 Type 2 diabetes mellitus without complications: Secondary | ICD-10-CM | POA: Diagnosis not present

## 2023-06-13 DIAGNOSIS — I743 Embolism and thrombosis of arteries of the lower extremities: Secondary | ICD-10-CM | POA: Diagnosis not present

## 2023-06-13 DIAGNOSIS — I10 Essential (primary) hypertension: Secondary | ICD-10-CM | POA: Diagnosis not present

## 2023-06-16 DIAGNOSIS — I743 Embolism and thrombosis of arteries of the lower extremities: Secondary | ICD-10-CM | POA: Diagnosis not present

## 2023-06-16 DIAGNOSIS — E785 Hyperlipidemia, unspecified: Secondary | ICD-10-CM | POA: Diagnosis not present

## 2023-06-16 DIAGNOSIS — E119 Type 2 diabetes mellitus without complications: Secondary | ICD-10-CM | POA: Diagnosis not present

## 2023-06-16 DIAGNOSIS — I10 Essential (primary) hypertension: Secondary | ICD-10-CM | POA: Diagnosis not present

## 2023-06-16 DIAGNOSIS — J449 Chronic obstructive pulmonary disease, unspecified: Secondary | ICD-10-CM | POA: Diagnosis not present

## 2023-06-16 DIAGNOSIS — M6284 Sarcopenia: Secondary | ICD-10-CM | POA: Diagnosis not present

## 2023-06-17 DIAGNOSIS — E119 Type 2 diabetes mellitus without complications: Secondary | ICD-10-CM | POA: Diagnosis not present

## 2023-06-17 DIAGNOSIS — I10 Essential (primary) hypertension: Secondary | ICD-10-CM | POA: Diagnosis not present

## 2023-06-17 DIAGNOSIS — J449 Chronic obstructive pulmonary disease, unspecified: Secondary | ICD-10-CM | POA: Diagnosis not present

## 2023-06-17 DIAGNOSIS — M6284 Sarcopenia: Secondary | ICD-10-CM | POA: Diagnosis not present

## 2023-06-17 DIAGNOSIS — E785 Hyperlipidemia, unspecified: Secondary | ICD-10-CM | POA: Diagnosis not present

## 2023-06-17 DIAGNOSIS — I743 Embolism and thrombosis of arteries of the lower extremities: Secondary | ICD-10-CM | POA: Diagnosis not present

## 2023-06-18 DIAGNOSIS — N400 Enlarged prostate without lower urinary tract symptoms: Secondary | ICD-10-CM | POA: Diagnosis present

## 2023-06-18 DIAGNOSIS — K219 Gastro-esophageal reflux disease without esophagitis: Secondary | ICD-10-CM | POA: Diagnosis present

## 2023-06-18 DIAGNOSIS — D509 Iron deficiency anemia, unspecified: Secondary | ICD-10-CM | POA: Diagnosis present

## 2023-06-18 DIAGNOSIS — J9621 Acute and chronic respiratory failure with hypoxia: Secondary | ICD-10-CM | POA: Diagnosis present

## 2023-06-18 DIAGNOSIS — Z923 Personal history of irradiation: Secondary | ICD-10-CM | POA: Diagnosis not present

## 2023-06-18 DIAGNOSIS — Z86711 Personal history of pulmonary embolism: Secondary | ICD-10-CM | POA: Diagnosis not present

## 2023-06-18 DIAGNOSIS — J189 Pneumonia, unspecified organism: Secondary | ICD-10-CM | POA: Diagnosis not present

## 2023-06-18 DIAGNOSIS — Z9981 Dependence on supplemental oxygen: Secondary | ICD-10-CM | POA: Diagnosis not present

## 2023-06-18 DIAGNOSIS — J440 Chronic obstructive pulmonary disease with acute lower respiratory infection: Secondary | ICD-10-CM | POA: Diagnosis present

## 2023-06-18 DIAGNOSIS — K5090 Crohn's disease, unspecified, without complications: Secondary | ICD-10-CM | POA: Diagnosis present

## 2023-06-18 DIAGNOSIS — Z8546 Personal history of malignant neoplasm of prostate: Secondary | ICD-10-CM | POA: Diagnosis not present

## 2023-06-18 DIAGNOSIS — K50919 Crohn's disease, unspecified, with unspecified complications: Secondary | ICD-10-CM | POA: Diagnosis not present

## 2023-06-18 DIAGNOSIS — Z0289 Encounter for other administrative examinations: Secondary | ICD-10-CM | POA: Diagnosis not present

## 2023-06-18 DIAGNOSIS — Z86718 Personal history of other venous thrombosis and embolism: Secondary | ICD-10-CM | POA: Diagnosis not present

## 2023-06-18 DIAGNOSIS — J441 Chronic obstructive pulmonary disease with (acute) exacerbation: Secondary | ICD-10-CM | POA: Diagnosis present

## 2023-06-18 DIAGNOSIS — R911 Solitary pulmonary nodule: Secondary | ICD-10-CM | POA: Diagnosis present

## 2023-06-18 DIAGNOSIS — Z22322 Carrier or suspected carrier of Methicillin resistant Staphylococcus aureus: Secondary | ICD-10-CM | POA: Diagnosis not present

## 2023-06-18 DIAGNOSIS — Z20822 Contact with and (suspected) exposure to covid-19: Secondary | ICD-10-CM | POA: Diagnosis not present

## 2023-06-18 DIAGNOSIS — Z87891 Personal history of nicotine dependence: Secondary | ICD-10-CM | POA: Diagnosis not present

## 2023-06-18 DIAGNOSIS — J432 Centrilobular emphysema: Secondary | ICD-10-CM | POA: Diagnosis present

## 2023-06-18 DIAGNOSIS — R918 Other nonspecific abnormal finding of lung field: Secondary | ICD-10-CM | POA: Diagnosis not present

## 2023-06-18 DIAGNOSIS — J431 Panlobular emphysema: Secondary | ICD-10-CM | POA: Diagnosis present

## 2023-06-18 DIAGNOSIS — E785 Hyperlipidemia, unspecified: Secondary | ICD-10-CM | POA: Diagnosis present

## 2023-06-18 DIAGNOSIS — Z79899 Other long term (current) drug therapy: Secondary | ICD-10-CM | POA: Diagnosis not present

## 2023-06-18 DIAGNOSIS — J15212 Pneumonia due to Methicillin resistant Staphylococcus aureus: Secondary | ICD-10-CM | POA: Diagnosis present

## 2023-06-18 DIAGNOSIS — Z8701 Personal history of pneumonia (recurrent): Secondary | ICD-10-CM | POA: Diagnosis not present

## 2023-06-18 DIAGNOSIS — Z8614 Personal history of Methicillin resistant Staphylococcus aureus infection: Secondary | ICD-10-CM | POA: Diagnosis not present

## 2023-06-18 DIAGNOSIS — J9601 Acute respiratory failure with hypoxia: Secondary | ICD-10-CM | POA: Diagnosis not present

## 2023-06-18 DIAGNOSIS — R000 Tachycardia, unspecified: Secondary | ICD-10-CM | POA: Diagnosis present

## 2023-06-18 DIAGNOSIS — I10 Essential (primary) hypertension: Secondary | ICD-10-CM | POA: Diagnosis present

## 2023-06-18 DIAGNOSIS — Z882 Allergy status to sulfonamides status: Secondary | ICD-10-CM | POA: Diagnosis not present

## 2023-06-18 DIAGNOSIS — R0602 Shortness of breath: Secondary | ICD-10-CM | POA: Diagnosis not present

## 2023-06-18 DIAGNOSIS — Z1152 Encounter for screening for COVID-19: Secondary | ICD-10-CM | POA: Diagnosis not present

## 2023-06-19 DIAGNOSIS — Z8701 Personal history of pneumonia (recurrent): Secondary | ICD-10-CM | POA: Diagnosis not present

## 2023-06-19 DIAGNOSIS — Z87891 Personal history of nicotine dependence: Secondary | ICD-10-CM | POA: Diagnosis not present

## 2023-06-19 DIAGNOSIS — Z1152 Encounter for screening for COVID-19: Secondary | ICD-10-CM | POA: Diagnosis not present

## 2023-06-19 DIAGNOSIS — R911 Solitary pulmonary nodule: Secondary | ICD-10-CM | POA: Diagnosis present

## 2023-06-19 DIAGNOSIS — Z8546 Personal history of malignant neoplasm of prostate: Secondary | ICD-10-CM | POA: Diagnosis not present

## 2023-06-19 DIAGNOSIS — Z8614 Personal history of Methicillin resistant Staphylococcus aureus infection: Secondary | ICD-10-CM | POA: Diagnosis not present

## 2023-06-19 DIAGNOSIS — D509 Iron deficiency anemia, unspecified: Secondary | ICD-10-CM | POA: Diagnosis present

## 2023-06-19 DIAGNOSIS — K219 Gastro-esophageal reflux disease without esophagitis: Secondary | ICD-10-CM | POA: Diagnosis present

## 2023-06-19 DIAGNOSIS — Z923 Personal history of irradiation: Secondary | ICD-10-CM | POA: Diagnosis not present

## 2023-06-19 DIAGNOSIS — E785 Hyperlipidemia, unspecified: Secondary | ICD-10-CM | POA: Diagnosis present

## 2023-06-19 DIAGNOSIS — I10 Essential (primary) hypertension: Secondary | ICD-10-CM | POA: Diagnosis present

## 2023-06-19 DIAGNOSIS — J431 Panlobular emphysema: Secondary | ICD-10-CM | POA: Diagnosis present

## 2023-06-19 DIAGNOSIS — Z86711 Personal history of pulmonary embolism: Secondary | ICD-10-CM | POA: Diagnosis not present

## 2023-06-19 DIAGNOSIS — Z86718 Personal history of other venous thrombosis and embolism: Secondary | ICD-10-CM | POA: Diagnosis not present

## 2023-06-19 DIAGNOSIS — Z79899 Other long term (current) drug therapy: Secondary | ICD-10-CM | POA: Diagnosis not present

## 2023-06-19 DIAGNOSIS — J9621 Acute and chronic respiratory failure with hypoxia: Secondary | ICD-10-CM | POA: Diagnosis present

## 2023-06-19 DIAGNOSIS — J441 Chronic obstructive pulmonary disease with (acute) exacerbation: Secondary | ICD-10-CM | POA: Diagnosis present

## 2023-06-19 DIAGNOSIS — Z9981 Dependence on supplemental oxygen: Secondary | ICD-10-CM | POA: Diagnosis not present

## 2023-06-19 DIAGNOSIS — Z22322 Carrier or suspected carrier of Methicillin resistant Staphylococcus aureus: Secondary | ICD-10-CM | POA: Diagnosis not present

## 2023-06-19 DIAGNOSIS — N400 Enlarged prostate without lower urinary tract symptoms: Secondary | ICD-10-CM | POA: Diagnosis present

## 2023-06-19 DIAGNOSIS — J432 Centrilobular emphysema: Secondary | ICD-10-CM | POA: Diagnosis present

## 2023-06-19 DIAGNOSIS — Z882 Allergy status to sulfonamides status: Secondary | ICD-10-CM | POA: Diagnosis not present

## 2023-06-19 DIAGNOSIS — J189 Pneumonia, unspecified organism: Secondary | ICD-10-CM | POA: Diagnosis not present

## 2023-06-19 DIAGNOSIS — R000 Tachycardia, unspecified: Secondary | ICD-10-CM | POA: Diagnosis present

## 2023-06-19 DIAGNOSIS — K5090 Crohn's disease, unspecified, without complications: Secondary | ICD-10-CM | POA: Diagnosis present

## 2023-06-19 DIAGNOSIS — K50919 Crohn's disease, unspecified, with unspecified complications: Secondary | ICD-10-CM | POA: Diagnosis not present

## 2023-06-19 DIAGNOSIS — J15212 Pneumonia due to Methicillin resistant Staphylococcus aureus: Secondary | ICD-10-CM | POA: Diagnosis present

## 2023-06-19 DIAGNOSIS — J440 Chronic obstructive pulmonary disease with acute lower respiratory infection: Secondary | ICD-10-CM | POA: Diagnosis present

## 2023-06-20 DIAGNOSIS — J441 Chronic obstructive pulmonary disease with (acute) exacerbation: Secondary | ICD-10-CM | POA: Diagnosis not present

## 2023-06-21 DIAGNOSIS — Z8614 Personal history of Methicillin resistant Staphylococcus aureus infection: Secondary | ICD-10-CM | POA: Diagnosis not present

## 2023-06-21 DIAGNOSIS — J441 Chronic obstructive pulmonary disease with (acute) exacerbation: Secondary | ICD-10-CM | POA: Diagnosis not present

## 2023-06-21 DIAGNOSIS — J9621 Acute and chronic respiratory failure with hypoxia: Secondary | ICD-10-CM | POA: Diagnosis not present

## 2023-06-21 DIAGNOSIS — K50919 Crohn's disease, unspecified, with unspecified complications: Secondary | ICD-10-CM | POA: Diagnosis not present

## 2023-06-21 DIAGNOSIS — D509 Iron deficiency anemia, unspecified: Secondary | ICD-10-CM | POA: Diagnosis not present

## 2023-06-21 DIAGNOSIS — J189 Pneumonia, unspecified organism: Secondary | ICD-10-CM | POA: Diagnosis not present

## 2023-06-22 DIAGNOSIS — J189 Pneumonia, unspecified organism: Secondary | ICD-10-CM | POA: Diagnosis not present

## 2023-06-22 DIAGNOSIS — J441 Chronic obstructive pulmonary disease with (acute) exacerbation: Secondary | ICD-10-CM | POA: Diagnosis not present

## 2023-06-22 DIAGNOSIS — J9621 Acute and chronic respiratory failure with hypoxia: Secondary | ICD-10-CM | POA: Diagnosis not present

## 2023-06-22 DIAGNOSIS — K50919 Crohn's disease, unspecified, with unspecified complications: Secondary | ICD-10-CM | POA: Diagnosis not present

## 2023-06-22 DIAGNOSIS — D509 Iron deficiency anemia, unspecified: Secondary | ICD-10-CM | POA: Diagnosis not present

## 2023-06-22 DIAGNOSIS — Z8614 Personal history of Methicillin resistant Staphylococcus aureus infection: Secondary | ICD-10-CM | POA: Diagnosis not present

## 2023-06-23 DIAGNOSIS — I743 Embolism and thrombosis of arteries of the lower extremities: Secondary | ICD-10-CM | POA: Diagnosis not present

## 2023-06-23 DIAGNOSIS — M6284 Sarcopenia: Secondary | ICD-10-CM | POA: Diagnosis not present

## 2023-06-23 DIAGNOSIS — E119 Type 2 diabetes mellitus without complications: Secondary | ICD-10-CM | POA: Diagnosis not present

## 2023-06-23 DIAGNOSIS — E785 Hyperlipidemia, unspecified: Secondary | ICD-10-CM | POA: Diagnosis not present

## 2023-06-23 DIAGNOSIS — I10 Essential (primary) hypertension: Secondary | ICD-10-CM | POA: Diagnosis not present

## 2023-06-23 DIAGNOSIS — J449 Chronic obstructive pulmonary disease, unspecified: Secondary | ICD-10-CM | POA: Diagnosis not present

## 2023-06-24 DIAGNOSIS — J441 Chronic obstructive pulmonary disease with (acute) exacerbation: Secondary | ICD-10-CM | POA: Diagnosis not present

## 2023-06-24 DIAGNOSIS — C3412 Malignant neoplasm of upper lobe, left bronchus or lung: Secondary | ICD-10-CM | POA: Diagnosis not present

## 2023-06-24 DIAGNOSIS — J181 Lobar pneumonia, unspecified organism: Secondary | ICD-10-CM | POA: Diagnosis not present

## 2023-06-24 DIAGNOSIS — R0989 Other specified symptoms and signs involving the circulatory and respiratory systems: Secondary | ICD-10-CM | POA: Diagnosis not present

## 2023-06-28 DIAGNOSIS — M6284 Sarcopenia: Secondary | ICD-10-CM | POA: Diagnosis not present

## 2023-06-28 DIAGNOSIS — E119 Type 2 diabetes mellitus without complications: Secondary | ICD-10-CM | POA: Diagnosis not present

## 2023-06-28 DIAGNOSIS — I743 Embolism and thrombosis of arteries of the lower extremities: Secondary | ICD-10-CM | POA: Diagnosis not present

## 2023-06-28 DIAGNOSIS — J449 Chronic obstructive pulmonary disease, unspecified: Secondary | ICD-10-CM | POA: Diagnosis not present

## 2023-06-28 DIAGNOSIS — E785 Hyperlipidemia, unspecified: Secondary | ICD-10-CM | POA: Diagnosis not present

## 2023-06-28 DIAGNOSIS — I10 Essential (primary) hypertension: Secondary | ICD-10-CM | POA: Diagnosis not present

## 2023-06-29 DIAGNOSIS — I10 Essential (primary) hypertension: Secondary | ICD-10-CM | POA: Diagnosis not present

## 2023-06-29 DIAGNOSIS — I743 Embolism and thrombosis of arteries of the lower extremities: Secondary | ICD-10-CM | POA: Diagnosis not present

## 2023-06-29 DIAGNOSIS — J449 Chronic obstructive pulmonary disease, unspecified: Secondary | ICD-10-CM | POA: Diagnosis not present

## 2023-06-29 DIAGNOSIS — M6284 Sarcopenia: Secondary | ICD-10-CM | POA: Diagnosis not present

## 2023-06-29 DIAGNOSIS — E119 Type 2 diabetes mellitus without complications: Secondary | ICD-10-CM | POA: Diagnosis not present

## 2023-06-29 DIAGNOSIS — E785 Hyperlipidemia, unspecified: Secondary | ICD-10-CM | POA: Diagnosis not present

## 2023-06-30 DIAGNOSIS — M6284 Sarcopenia: Secondary | ICD-10-CM | POA: Diagnosis not present

## 2023-06-30 DIAGNOSIS — I743 Embolism and thrombosis of arteries of the lower extremities: Secondary | ICD-10-CM | POA: Diagnosis not present

## 2023-06-30 DIAGNOSIS — J449 Chronic obstructive pulmonary disease, unspecified: Secondary | ICD-10-CM | POA: Diagnosis not present

## 2023-06-30 DIAGNOSIS — I10 Essential (primary) hypertension: Secondary | ICD-10-CM | POA: Diagnosis not present

## 2023-06-30 DIAGNOSIS — E119 Type 2 diabetes mellitus without complications: Secondary | ICD-10-CM | POA: Diagnosis not present

## 2023-06-30 DIAGNOSIS — E785 Hyperlipidemia, unspecified: Secondary | ICD-10-CM | POA: Diagnosis not present

## 2023-07-01 DIAGNOSIS — J189 Pneumonia, unspecified organism: Secondary | ICD-10-CM | POA: Diagnosis not present

## 2023-07-01 DIAGNOSIS — C3412 Malignant neoplasm of upper lobe, left bronchus or lung: Secondary | ICD-10-CM | POA: Diagnosis not present

## 2023-07-01 DIAGNOSIS — Z923 Personal history of irradiation: Secondary | ICD-10-CM | POA: Diagnosis not present

## 2023-07-02 DIAGNOSIS — E119 Type 2 diabetes mellitus without complications: Secondary | ICD-10-CM | POA: Diagnosis not present

## 2023-07-02 DIAGNOSIS — M6284 Sarcopenia: Secondary | ICD-10-CM | POA: Diagnosis not present

## 2023-07-02 DIAGNOSIS — I10 Essential (primary) hypertension: Secondary | ICD-10-CM | POA: Diagnosis not present

## 2023-07-02 DIAGNOSIS — I743 Embolism and thrombosis of arteries of the lower extremities: Secondary | ICD-10-CM | POA: Diagnosis not present

## 2023-07-02 DIAGNOSIS — E785 Hyperlipidemia, unspecified: Secondary | ICD-10-CM | POA: Diagnosis not present

## 2023-07-02 DIAGNOSIS — J449 Chronic obstructive pulmonary disease, unspecified: Secondary | ICD-10-CM | POA: Diagnosis not present

## 2023-07-05 DIAGNOSIS — I10 Essential (primary) hypertension: Secondary | ICD-10-CM | POA: Diagnosis not present

## 2023-07-05 DIAGNOSIS — E785 Hyperlipidemia, unspecified: Secondary | ICD-10-CM | POA: Diagnosis not present

## 2023-07-05 DIAGNOSIS — I743 Embolism and thrombosis of arteries of the lower extremities: Secondary | ICD-10-CM | POA: Diagnosis not present

## 2023-07-05 DIAGNOSIS — M6284 Sarcopenia: Secondary | ICD-10-CM | POA: Diagnosis not present

## 2023-07-05 DIAGNOSIS — E119 Type 2 diabetes mellitus without complications: Secondary | ICD-10-CM | POA: Diagnosis not present

## 2023-07-05 DIAGNOSIS — J449 Chronic obstructive pulmonary disease, unspecified: Secondary | ICD-10-CM | POA: Diagnosis not present

## 2023-07-07 DIAGNOSIS — E119 Type 2 diabetes mellitus without complications: Secondary | ICD-10-CM | POA: Diagnosis not present

## 2023-07-07 DIAGNOSIS — I743 Embolism and thrombosis of arteries of the lower extremities: Secondary | ICD-10-CM | POA: Diagnosis not present

## 2023-07-07 DIAGNOSIS — M6284 Sarcopenia: Secondary | ICD-10-CM | POA: Diagnosis not present

## 2023-07-07 DIAGNOSIS — E785 Hyperlipidemia, unspecified: Secondary | ICD-10-CM | POA: Diagnosis not present

## 2023-07-07 DIAGNOSIS — I10 Essential (primary) hypertension: Secondary | ICD-10-CM | POA: Diagnosis not present

## 2023-07-07 DIAGNOSIS — J449 Chronic obstructive pulmonary disease, unspecified: Secondary | ICD-10-CM | POA: Diagnosis not present

## 2023-07-08 DIAGNOSIS — R918 Other nonspecific abnormal finding of lung field: Secondary | ICD-10-CM | POA: Diagnosis not present

## 2023-07-08 DIAGNOSIS — J9621 Acute and chronic respiratory failure with hypoxia: Secondary | ICD-10-CM | POA: Diagnosis not present

## 2023-07-08 DIAGNOSIS — M6284 Sarcopenia: Secondary | ICD-10-CM | POA: Diagnosis not present

## 2023-07-08 DIAGNOSIS — I10 Essential (primary) hypertension: Secondary | ICD-10-CM | POA: Diagnosis not present

## 2023-07-08 DIAGNOSIS — E119 Type 2 diabetes mellitus without complications: Secondary | ICD-10-CM | POA: Diagnosis not present

## 2023-07-08 DIAGNOSIS — Z681 Body mass index (BMI) 19 or less, adult: Secondary | ICD-10-CM | POA: Diagnosis not present

## 2023-07-08 DIAGNOSIS — E785 Hyperlipidemia, unspecified: Secondary | ICD-10-CM | POA: Diagnosis not present

## 2023-07-08 DIAGNOSIS — I743 Embolism and thrombosis of arteries of the lower extremities: Secondary | ICD-10-CM | POA: Diagnosis not present

## 2023-07-08 DIAGNOSIS — J441 Chronic obstructive pulmonary disease with (acute) exacerbation: Secondary | ICD-10-CM | POA: Diagnosis not present

## 2023-07-08 DIAGNOSIS — J449 Chronic obstructive pulmonary disease, unspecified: Secondary | ICD-10-CM | POA: Diagnosis not present

## 2023-07-08 DIAGNOSIS — K5190 Ulcerative colitis, unspecified, without complications: Secondary | ICD-10-CM | POA: Diagnosis not present

## 2023-07-09 DIAGNOSIS — Z8546 Personal history of malignant neoplasm of prostate: Secondary | ICD-10-CM | POA: Diagnosis not present

## 2023-07-09 DIAGNOSIS — R338 Other retention of urine: Secondary | ICD-10-CM | POA: Diagnosis not present

## 2023-07-09 DIAGNOSIS — N401 Enlarged prostate with lower urinary tract symptoms: Secondary | ICD-10-CM | POA: Diagnosis not present

## 2023-07-11 DIAGNOSIS — Z79899 Other long term (current) drug therapy: Secondary | ICD-10-CM | POA: Diagnosis not present

## 2023-07-11 DIAGNOSIS — D500 Iron deficiency anemia secondary to blood loss (chronic): Secondary | ICD-10-CM | POA: Diagnosis not present

## 2023-07-11 DIAGNOSIS — E785 Hyperlipidemia, unspecified: Secondary | ICD-10-CM | POA: Diagnosis not present

## 2023-07-11 DIAGNOSIS — Z20822 Contact with and (suspected) exposure to covid-19: Secondary | ICD-10-CM | POA: Diagnosis not present

## 2023-07-11 DIAGNOSIS — K5090 Crohn's disease, unspecified, without complications: Secondary | ICD-10-CM | POA: Diagnosis not present

## 2023-07-11 DIAGNOSIS — J441 Chronic obstructive pulmonary disease with (acute) exacerbation: Secondary | ICD-10-CM | POA: Diagnosis not present

## 2023-07-11 DIAGNOSIS — J9621 Acute and chronic respiratory failure with hypoxia: Secondary | ICD-10-CM | POA: Diagnosis present

## 2023-07-11 DIAGNOSIS — R0689 Other abnormalities of breathing: Secondary | ICD-10-CM | POA: Diagnosis not present

## 2023-07-11 DIAGNOSIS — J189 Pneumonia, unspecified organism: Secondary | ICD-10-CM | POA: Diagnosis not present

## 2023-07-11 DIAGNOSIS — B3749 Other urogenital candidiasis: Secondary | ICD-10-CM | POA: Diagnosis not present

## 2023-07-11 DIAGNOSIS — G319 Degenerative disease of nervous system, unspecified: Secondary | ICD-10-CM | POA: Diagnosis not present

## 2023-07-11 DIAGNOSIS — Z7951 Long term (current) use of inhaled steroids: Secondary | ICD-10-CM | POA: Diagnosis not present

## 2023-07-11 DIAGNOSIS — Z8546 Personal history of malignant neoplasm of prostate: Secondary | ICD-10-CM | POA: Diagnosis not present

## 2023-07-11 DIAGNOSIS — Z1152 Encounter for screening for COVID-19: Secondary | ICD-10-CM | POA: Diagnosis not present

## 2023-07-11 DIAGNOSIS — N400 Enlarged prostate without lower urinary tract symptoms: Secondary | ICD-10-CM | POA: Diagnosis not present

## 2023-07-11 DIAGNOSIS — J440 Chronic obstructive pulmonary disease with acute lower respiratory infection: Secondary | ICD-10-CM | POA: Diagnosis not present

## 2023-07-11 DIAGNOSIS — Z9981 Dependence on supplemental oxygen: Secondary | ICD-10-CM | POA: Diagnosis not present

## 2023-07-11 DIAGNOSIS — J181 Lobar pneumonia, unspecified organism: Secondary | ICD-10-CM | POA: Diagnosis not present

## 2023-07-11 DIAGNOSIS — Z86711 Personal history of pulmonary embolism: Secondary | ICD-10-CM | POA: Diagnosis not present

## 2023-07-11 DIAGNOSIS — Z923 Personal history of irradiation: Secondary | ICD-10-CM | POA: Diagnosis not present

## 2023-07-11 DIAGNOSIS — K219 Gastro-esophageal reflux disease without esophagitis: Secondary | ICD-10-CM | POA: Diagnosis not present

## 2023-07-11 DIAGNOSIS — R0902 Hypoxemia: Secondary | ICD-10-CM | POA: Diagnosis not present

## 2023-07-11 DIAGNOSIS — I10 Essential (primary) hypertension: Secondary | ICD-10-CM | POA: Diagnosis not present

## 2023-07-16 DIAGNOSIS — J440 Chronic obstructive pulmonary disease with acute lower respiratory infection: Secondary | ICD-10-CM | POA: Diagnosis not present

## 2023-07-16 DIAGNOSIS — N400 Enlarged prostate without lower urinary tract symptoms: Secondary | ICD-10-CM | POA: Diagnosis not present

## 2023-07-16 DIAGNOSIS — Z9981 Dependence on supplemental oxygen: Secondary | ICD-10-CM | POA: Diagnosis not present

## 2023-07-16 DIAGNOSIS — R338 Other retention of urine: Secondary | ICD-10-CM | POA: Diagnosis not present

## 2023-07-16 DIAGNOSIS — J441 Chronic obstructive pulmonary disease with (acute) exacerbation: Secondary | ICD-10-CM | POA: Diagnosis not present

## 2023-07-16 DIAGNOSIS — K219 Gastro-esophageal reflux disease without esophagitis: Secondary | ICD-10-CM | POA: Diagnosis not present

## 2023-07-16 DIAGNOSIS — B3741 Candidal cystitis and urethritis: Secondary | ICD-10-CM | POA: Diagnosis not present

## 2023-07-16 DIAGNOSIS — J9621 Acute and chronic respiratory failure with hypoxia: Secondary | ICD-10-CM | POA: Diagnosis not present

## 2023-07-16 DIAGNOSIS — Z87440 Personal history of urinary (tract) infections: Secondary | ICD-10-CM | POA: Diagnosis not present

## 2023-07-16 DIAGNOSIS — D509 Iron deficiency anemia, unspecified: Secondary | ICD-10-CM | POA: Diagnosis not present

## 2023-07-16 DIAGNOSIS — E860 Dehydration: Secondary | ICD-10-CM | POA: Diagnosis not present

## 2023-07-16 DIAGNOSIS — J15212 Pneumonia due to Methicillin resistant Staphylococcus aureus: Secondary | ICD-10-CM | POA: Diagnosis not present

## 2023-07-16 DIAGNOSIS — I10 Essential (primary) hypertension: Secondary | ICD-10-CM | POA: Diagnosis not present

## 2023-07-19 DIAGNOSIS — Z87891 Personal history of nicotine dependence: Secondary | ICD-10-CM | POA: Diagnosis not present

## 2023-07-19 DIAGNOSIS — I743 Embolism and thrombosis of arteries of the lower extremities: Secondary | ICD-10-CM | POA: Diagnosis not present

## 2023-07-19 DIAGNOSIS — D696 Thrombocytopenia, unspecified: Secondary | ICD-10-CM | POA: Diagnosis not present

## 2023-07-19 DIAGNOSIS — K5090 Crohn's disease, unspecified, without complications: Secondary | ICD-10-CM | POA: Diagnosis not present

## 2023-07-19 DIAGNOSIS — E559 Vitamin D deficiency, unspecified: Secondary | ICD-10-CM | POA: Diagnosis not present

## 2023-07-19 DIAGNOSIS — N400 Enlarged prostate without lower urinary tract symptoms: Secondary | ICD-10-CM | POA: Diagnosis not present

## 2023-07-19 DIAGNOSIS — K219 Gastro-esophageal reflux disease without esophagitis: Secondary | ICD-10-CM | POA: Diagnosis not present

## 2023-07-19 DIAGNOSIS — M6284 Sarcopenia: Secondary | ICD-10-CM | POA: Diagnosis not present

## 2023-07-19 DIAGNOSIS — J449 Chronic obstructive pulmonary disease, unspecified: Secondary | ICD-10-CM | POA: Diagnosis not present

## 2023-07-19 DIAGNOSIS — E119 Type 2 diabetes mellitus without complications: Secondary | ICD-10-CM | POA: Diagnosis not present

## 2023-07-19 DIAGNOSIS — I10 Essential (primary) hypertension: Secondary | ICD-10-CM | POA: Diagnosis not present

## 2023-07-19 DIAGNOSIS — E785 Hyperlipidemia, unspecified: Secondary | ICD-10-CM | POA: Diagnosis not present

## 2023-07-20 DIAGNOSIS — J9621 Acute and chronic respiratory failure with hypoxia: Secondary | ICD-10-CM | POA: Diagnosis not present

## 2023-07-20 DIAGNOSIS — B3741 Candidal cystitis and urethritis: Secondary | ICD-10-CM | POA: Diagnosis not present

## 2023-07-20 DIAGNOSIS — J15212 Pneumonia due to Methicillin resistant Staphylococcus aureus: Secondary | ICD-10-CM | POA: Diagnosis not present

## 2023-07-20 DIAGNOSIS — I10 Essential (primary) hypertension: Secondary | ICD-10-CM | POA: Diagnosis not present

## 2023-07-20 DIAGNOSIS — J440 Chronic obstructive pulmonary disease with acute lower respiratory infection: Secondary | ICD-10-CM | POA: Diagnosis not present

## 2023-07-20 DIAGNOSIS — J441 Chronic obstructive pulmonary disease with (acute) exacerbation: Secondary | ICD-10-CM | POA: Diagnosis not present

## 2023-07-22 DIAGNOSIS — J15212 Pneumonia due to Methicillin resistant Staphylococcus aureus: Secondary | ICD-10-CM | POA: Diagnosis not present

## 2023-07-22 DIAGNOSIS — B3741 Candidal cystitis and urethritis: Secondary | ICD-10-CM | POA: Diagnosis not present

## 2023-07-22 DIAGNOSIS — J441 Chronic obstructive pulmonary disease with (acute) exacerbation: Secondary | ICD-10-CM | POA: Diagnosis not present

## 2023-07-22 DIAGNOSIS — I10 Essential (primary) hypertension: Secondary | ICD-10-CM | POA: Diagnosis not present

## 2023-07-22 DIAGNOSIS — J9621 Acute and chronic respiratory failure with hypoxia: Secondary | ICD-10-CM | POA: Diagnosis not present

## 2023-07-22 DIAGNOSIS — C3490 Malignant neoplasm of unspecified part of unspecified bronchus or lung: Secondary | ICD-10-CM | POA: Diagnosis not present

## 2023-07-22 DIAGNOSIS — J440 Chronic obstructive pulmonary disease with acute lower respiratory infection: Secondary | ICD-10-CM | POA: Diagnosis not present

## 2023-07-23 DIAGNOSIS — R000 Tachycardia, unspecified: Secondary | ICD-10-CM | POA: Diagnosis not present

## 2023-07-23 DIAGNOSIS — K50919 Crohn's disease, unspecified, with unspecified complications: Secondary | ICD-10-CM | POA: Diagnosis not present

## 2023-07-23 DIAGNOSIS — J440 Chronic obstructive pulmonary disease with acute lower respiratory infection: Secondary | ICD-10-CM | POA: Diagnosis not present

## 2023-07-23 DIAGNOSIS — I5032 Chronic diastolic (congestive) heart failure: Secondary | ICD-10-CM | POA: Diagnosis not present

## 2023-07-23 DIAGNOSIS — I10 Essential (primary) hypertension: Secondary | ICD-10-CM | POA: Diagnosis not present

## 2023-07-23 DIAGNOSIS — J441 Chronic obstructive pulmonary disease with (acute) exacerbation: Secondary | ICD-10-CM | POA: Diagnosis not present

## 2023-07-23 DIAGNOSIS — B3741 Candidal cystitis and urethritis: Secondary | ICD-10-CM | POA: Diagnosis not present

## 2023-07-23 DIAGNOSIS — R911 Solitary pulmonary nodule: Secondary | ICD-10-CM | POA: Diagnosis not present

## 2023-07-23 DIAGNOSIS — E1165 Type 2 diabetes mellitus with hyperglycemia: Secondary | ICD-10-CM | POA: Diagnosis not present

## 2023-07-23 DIAGNOSIS — J15212 Pneumonia due to Methicillin resistant Staphylococcus aureus: Secondary | ICD-10-CM | POA: Diagnosis not present

## 2023-07-23 DIAGNOSIS — I2699 Other pulmonary embolism without acute cor pulmonale: Secondary | ICD-10-CM | POA: Diagnosis not present

## 2023-07-23 DIAGNOSIS — J9621 Acute and chronic respiratory failure with hypoxia: Secondary | ICD-10-CM | POA: Diagnosis not present

## 2023-07-27 DIAGNOSIS — J15212 Pneumonia due to Methicillin resistant Staphylococcus aureus: Secondary | ICD-10-CM | POA: Diagnosis not present

## 2023-07-27 DIAGNOSIS — J441 Chronic obstructive pulmonary disease with (acute) exacerbation: Secondary | ICD-10-CM | POA: Diagnosis not present

## 2023-07-27 DIAGNOSIS — I10 Essential (primary) hypertension: Secondary | ICD-10-CM | POA: Diagnosis not present

## 2023-07-27 DIAGNOSIS — B3741 Candidal cystitis and urethritis: Secondary | ICD-10-CM | POA: Diagnosis not present

## 2023-07-27 DIAGNOSIS — J440 Chronic obstructive pulmonary disease with acute lower respiratory infection: Secondary | ICD-10-CM | POA: Diagnosis not present

## 2023-07-27 DIAGNOSIS — J9621 Acute and chronic respiratory failure with hypoxia: Secondary | ICD-10-CM | POA: Diagnosis not present

## 2023-07-28 DIAGNOSIS — R911 Solitary pulmonary nodule: Secondary | ICD-10-CM | POA: Diagnosis not present

## 2023-07-28 DIAGNOSIS — J441 Chronic obstructive pulmonary disease with (acute) exacerbation: Secondary | ICD-10-CM | POA: Diagnosis not present

## 2023-07-28 DIAGNOSIS — N400 Enlarged prostate without lower urinary tract symptoms: Secondary | ICD-10-CM | POA: Diagnosis not present

## 2023-07-28 DIAGNOSIS — D509 Iron deficiency anemia, unspecified: Secondary | ICD-10-CM | POA: Diagnosis not present

## 2023-07-28 DIAGNOSIS — Z87891 Personal history of nicotine dependence: Secondary | ICD-10-CM | POA: Diagnosis not present

## 2023-07-28 DIAGNOSIS — J449 Chronic obstructive pulmonary disease, unspecified: Secondary | ICD-10-CM | POA: Diagnosis not present

## 2023-07-28 DIAGNOSIS — R338 Other retention of urine: Secondary | ICD-10-CM | POA: Diagnosis not present

## 2023-07-28 DIAGNOSIS — E860 Dehydration: Secondary | ICD-10-CM | POA: Diagnosis not present

## 2023-07-28 DIAGNOSIS — J440 Chronic obstructive pulmonary disease with acute lower respiratory infection: Secondary | ICD-10-CM | POA: Diagnosis not present

## 2023-07-28 DIAGNOSIS — Z87440 Personal history of urinary (tract) infections: Secondary | ICD-10-CM | POA: Diagnosis not present

## 2023-07-28 DIAGNOSIS — J15212 Pneumonia due to Methicillin resistant Staphylococcus aureus: Secondary | ICD-10-CM | POA: Diagnosis not present

## 2023-07-28 DIAGNOSIS — I10 Essential (primary) hypertension: Secondary | ICD-10-CM | POA: Diagnosis not present

## 2023-07-28 DIAGNOSIS — K219 Gastro-esophageal reflux disease without esophagitis: Secondary | ICD-10-CM | POA: Diagnosis not present

## 2023-07-28 DIAGNOSIS — J9621 Acute and chronic respiratory failure with hypoxia: Secondary | ICD-10-CM | POA: Diagnosis not present

## 2023-07-28 DIAGNOSIS — B3741 Candidal cystitis and urethritis: Secondary | ICD-10-CM | POA: Diagnosis not present

## 2023-07-29 DIAGNOSIS — B3741 Candidal cystitis and urethritis: Secondary | ICD-10-CM | POA: Diagnosis not present

## 2023-07-29 DIAGNOSIS — J440 Chronic obstructive pulmonary disease with acute lower respiratory infection: Secondary | ICD-10-CM | POA: Diagnosis not present

## 2023-07-29 DIAGNOSIS — J9621 Acute and chronic respiratory failure with hypoxia: Secondary | ICD-10-CM | POA: Diagnosis not present

## 2023-07-29 DIAGNOSIS — I10 Essential (primary) hypertension: Secondary | ICD-10-CM | POA: Diagnosis not present

## 2023-07-29 DIAGNOSIS — J441 Chronic obstructive pulmonary disease with (acute) exacerbation: Secondary | ICD-10-CM | POA: Diagnosis not present

## 2023-07-29 DIAGNOSIS — J15212 Pneumonia due to Methicillin resistant Staphylococcus aureus: Secondary | ICD-10-CM | POA: Diagnosis not present

## 2023-07-30 DIAGNOSIS — J9621 Acute and chronic respiratory failure with hypoxia: Secondary | ICD-10-CM | POA: Diagnosis not present

## 2023-07-30 DIAGNOSIS — J440 Chronic obstructive pulmonary disease with acute lower respiratory infection: Secondary | ICD-10-CM | POA: Diagnosis not present

## 2023-07-30 DIAGNOSIS — J441 Chronic obstructive pulmonary disease with (acute) exacerbation: Secondary | ICD-10-CM | POA: Diagnosis not present

## 2023-07-30 DIAGNOSIS — B3741 Candidal cystitis and urethritis: Secondary | ICD-10-CM | POA: Diagnosis not present

## 2023-07-30 DIAGNOSIS — J15212 Pneumonia due to Methicillin resistant Staphylococcus aureus: Secondary | ICD-10-CM | POA: Diagnosis not present

## 2023-07-30 DIAGNOSIS — I10 Essential (primary) hypertension: Secondary | ICD-10-CM | POA: Diagnosis not present

## 2023-08-01 DIAGNOSIS — J984 Other disorders of lung: Secondary | ICD-10-CM | POA: Diagnosis not present

## 2023-08-01 DIAGNOSIS — R000 Tachycardia, unspecified: Secondary | ICD-10-CM | POA: Diagnosis not present

## 2023-08-01 DIAGNOSIS — R079 Chest pain, unspecified: Secondary | ICD-10-CM | POA: Diagnosis not present

## 2023-08-01 DIAGNOSIS — J441 Chronic obstructive pulmonary disease with (acute) exacerbation: Secondary | ICD-10-CM | POA: Diagnosis not present

## 2023-08-01 DIAGNOSIS — Z7951 Long term (current) use of inhaled steroids: Secondary | ICD-10-CM | POA: Diagnosis not present

## 2023-08-01 DIAGNOSIS — Z87891 Personal history of nicotine dependence: Secondary | ICD-10-CM | POA: Diagnosis not present

## 2023-08-01 DIAGNOSIS — I10 Essential (primary) hypertension: Secondary | ICD-10-CM | POA: Diagnosis not present

## 2023-08-01 DIAGNOSIS — Z1152 Encounter for screening for COVID-19: Secondary | ICD-10-CM | POA: Diagnosis not present

## 2023-08-01 DIAGNOSIS — Z86711 Personal history of pulmonary embolism: Secondary | ICD-10-CM | POA: Diagnosis not present

## 2023-08-01 DIAGNOSIS — I4892 Unspecified atrial flutter: Secondary | ICD-10-CM | POA: Diagnosis not present

## 2023-08-03 DIAGNOSIS — J15212 Pneumonia due to Methicillin resistant Staphylococcus aureus: Secondary | ICD-10-CM | POA: Diagnosis not present

## 2023-08-03 DIAGNOSIS — B3741 Candidal cystitis and urethritis: Secondary | ICD-10-CM | POA: Diagnosis not present

## 2023-08-03 DIAGNOSIS — I10 Essential (primary) hypertension: Secondary | ICD-10-CM | POA: Diagnosis not present

## 2023-08-03 DIAGNOSIS — J441 Chronic obstructive pulmonary disease with (acute) exacerbation: Secondary | ICD-10-CM | POA: Diagnosis not present

## 2023-08-03 DIAGNOSIS — J9621 Acute and chronic respiratory failure with hypoxia: Secondary | ICD-10-CM | POA: Diagnosis not present

## 2023-08-03 DIAGNOSIS — J440 Chronic obstructive pulmonary disease with acute lower respiratory infection: Secondary | ICD-10-CM | POA: Diagnosis not present

## 2023-08-04 DIAGNOSIS — B3741 Candidal cystitis and urethritis: Secondary | ICD-10-CM | POA: Diagnosis not present

## 2023-08-04 DIAGNOSIS — I10 Essential (primary) hypertension: Secondary | ICD-10-CM | POA: Diagnosis not present

## 2023-08-04 DIAGNOSIS — J440 Chronic obstructive pulmonary disease with acute lower respiratory infection: Secondary | ICD-10-CM | POA: Diagnosis not present

## 2023-08-04 DIAGNOSIS — J15212 Pneumonia due to Methicillin resistant Staphylococcus aureus: Secondary | ICD-10-CM | POA: Diagnosis not present

## 2023-08-04 DIAGNOSIS — J9621 Acute and chronic respiratory failure with hypoxia: Secondary | ICD-10-CM | POA: Diagnosis not present

## 2023-08-04 DIAGNOSIS — J441 Chronic obstructive pulmonary disease with (acute) exacerbation: Secondary | ICD-10-CM | POA: Diagnosis not present

## 2023-08-05 DIAGNOSIS — J9621 Acute and chronic respiratory failure with hypoxia: Secondary | ICD-10-CM | POA: Diagnosis not present

## 2023-08-05 DIAGNOSIS — J15212 Pneumonia due to Methicillin resistant Staphylococcus aureus: Secondary | ICD-10-CM | POA: Diagnosis not present

## 2023-08-05 DIAGNOSIS — I10 Essential (primary) hypertension: Secondary | ICD-10-CM | POA: Diagnosis not present

## 2023-08-05 DIAGNOSIS — J441 Chronic obstructive pulmonary disease with (acute) exacerbation: Secondary | ICD-10-CM | POA: Diagnosis not present

## 2023-08-05 DIAGNOSIS — B3741 Candidal cystitis and urethritis: Secondary | ICD-10-CM | POA: Diagnosis not present

## 2023-08-05 DIAGNOSIS — J440 Chronic obstructive pulmonary disease with acute lower respiratory infection: Secondary | ICD-10-CM | POA: Diagnosis not present

## 2023-08-06 DIAGNOSIS — J440 Chronic obstructive pulmonary disease with acute lower respiratory infection: Secondary | ICD-10-CM | POA: Diagnosis not present

## 2023-08-06 DIAGNOSIS — J9621 Acute and chronic respiratory failure with hypoxia: Secondary | ICD-10-CM | POA: Diagnosis not present

## 2023-08-06 DIAGNOSIS — I10 Essential (primary) hypertension: Secondary | ICD-10-CM | POA: Diagnosis not present

## 2023-08-06 DIAGNOSIS — J15212 Pneumonia due to Methicillin resistant Staphylococcus aureus: Secondary | ICD-10-CM | POA: Diagnosis not present

## 2023-08-06 DIAGNOSIS — J441 Chronic obstructive pulmonary disease with (acute) exacerbation: Secondary | ICD-10-CM | POA: Diagnosis not present

## 2023-08-06 DIAGNOSIS — B3741 Candidal cystitis and urethritis: Secondary | ICD-10-CM | POA: Diagnosis not present

## 2023-08-09 DIAGNOSIS — I10 Essential (primary) hypertension: Secondary | ICD-10-CM | POA: Diagnosis not present

## 2023-08-09 DIAGNOSIS — J15212 Pneumonia due to Methicillin resistant Staphylococcus aureus: Secondary | ICD-10-CM | POA: Diagnosis not present

## 2023-08-09 DIAGNOSIS — B3741 Candidal cystitis and urethritis: Secondary | ICD-10-CM | POA: Diagnosis not present

## 2023-08-09 DIAGNOSIS — J441 Chronic obstructive pulmonary disease with (acute) exacerbation: Secondary | ICD-10-CM | POA: Diagnosis not present

## 2023-08-09 DIAGNOSIS — J9621 Acute and chronic respiratory failure with hypoxia: Secondary | ICD-10-CM | POA: Diagnosis not present

## 2023-08-09 DIAGNOSIS — J440 Chronic obstructive pulmonary disease with acute lower respiratory infection: Secondary | ICD-10-CM | POA: Diagnosis not present

## 2023-08-10 DIAGNOSIS — J441 Chronic obstructive pulmonary disease with (acute) exacerbation: Secondary | ICD-10-CM | POA: Diagnosis not present

## 2023-08-10 DIAGNOSIS — I10 Essential (primary) hypertension: Secondary | ICD-10-CM | POA: Diagnosis not present

## 2023-08-10 DIAGNOSIS — J9621 Acute and chronic respiratory failure with hypoxia: Secondary | ICD-10-CM | POA: Diagnosis not present

## 2023-08-10 DIAGNOSIS — J440 Chronic obstructive pulmonary disease with acute lower respiratory infection: Secondary | ICD-10-CM | POA: Diagnosis not present

## 2023-08-10 DIAGNOSIS — J15212 Pneumonia due to Methicillin resistant Staphylococcus aureus: Secondary | ICD-10-CM | POA: Diagnosis not present

## 2023-08-10 DIAGNOSIS — B3741 Candidal cystitis and urethritis: Secondary | ICD-10-CM | POA: Diagnosis not present

## 2023-08-12 DIAGNOSIS — J9621 Acute and chronic respiratory failure with hypoxia: Secondary | ICD-10-CM | POA: Diagnosis not present

## 2023-08-12 DIAGNOSIS — J15212 Pneumonia due to Methicillin resistant Staphylococcus aureus: Secondary | ICD-10-CM | POA: Diagnosis not present

## 2023-08-12 DIAGNOSIS — B3741 Candidal cystitis and urethritis: Secondary | ICD-10-CM | POA: Diagnosis not present

## 2023-08-12 DIAGNOSIS — I10 Essential (primary) hypertension: Secondary | ICD-10-CM | POA: Diagnosis not present

## 2023-08-12 DIAGNOSIS — J441 Chronic obstructive pulmonary disease with (acute) exacerbation: Secondary | ICD-10-CM | POA: Diagnosis not present

## 2023-08-12 DIAGNOSIS — J440 Chronic obstructive pulmonary disease with acute lower respiratory infection: Secondary | ICD-10-CM | POA: Diagnosis not present

## 2023-08-13 DIAGNOSIS — J9621 Acute and chronic respiratory failure with hypoxia: Secondary | ICD-10-CM | POA: Diagnosis not present

## 2023-08-13 DIAGNOSIS — J15212 Pneumonia due to Methicillin resistant Staphylococcus aureus: Secondary | ICD-10-CM | POA: Diagnosis not present

## 2023-08-13 DIAGNOSIS — J440 Chronic obstructive pulmonary disease with acute lower respiratory infection: Secondary | ICD-10-CM | POA: Diagnosis not present

## 2023-08-13 DIAGNOSIS — J441 Chronic obstructive pulmonary disease with (acute) exacerbation: Secondary | ICD-10-CM | POA: Diagnosis not present

## 2023-08-13 DIAGNOSIS — B3741 Candidal cystitis and urethritis: Secondary | ICD-10-CM | POA: Diagnosis not present

## 2023-08-13 DIAGNOSIS — I10 Essential (primary) hypertension: Secondary | ICD-10-CM | POA: Diagnosis not present

## 2023-08-15 DIAGNOSIS — I10 Essential (primary) hypertension: Secondary | ICD-10-CM | POA: Diagnosis not present

## 2023-08-15 DIAGNOSIS — E860 Dehydration: Secondary | ICD-10-CM | POA: Diagnosis not present

## 2023-08-15 DIAGNOSIS — R338 Other retention of urine: Secondary | ICD-10-CM | POA: Diagnosis not present

## 2023-08-15 DIAGNOSIS — B3741 Candidal cystitis and urethritis: Secondary | ICD-10-CM | POA: Diagnosis not present

## 2023-08-15 DIAGNOSIS — J440 Chronic obstructive pulmonary disease with acute lower respiratory infection: Secondary | ICD-10-CM | POA: Diagnosis not present

## 2023-08-15 DIAGNOSIS — Z9981 Dependence on supplemental oxygen: Secondary | ICD-10-CM | POA: Diagnosis not present

## 2023-08-15 DIAGNOSIS — Z87440 Personal history of urinary (tract) infections: Secondary | ICD-10-CM | POA: Diagnosis not present

## 2023-08-15 DIAGNOSIS — J441 Chronic obstructive pulmonary disease with (acute) exacerbation: Secondary | ICD-10-CM | POA: Diagnosis not present

## 2023-08-15 DIAGNOSIS — J15212 Pneumonia due to Methicillin resistant Staphylococcus aureus: Secondary | ICD-10-CM | POA: Diagnosis not present

## 2023-08-15 DIAGNOSIS — J9621 Acute and chronic respiratory failure with hypoxia: Secondary | ICD-10-CM | POA: Diagnosis not present

## 2023-08-15 DIAGNOSIS — D509 Iron deficiency anemia, unspecified: Secondary | ICD-10-CM | POA: Diagnosis not present

## 2023-08-15 DIAGNOSIS — K219 Gastro-esophageal reflux disease without esophagitis: Secondary | ICD-10-CM | POA: Diagnosis not present

## 2023-08-15 DIAGNOSIS — N400 Enlarged prostate without lower urinary tract symptoms: Secondary | ICD-10-CM | POA: Diagnosis not present

## 2023-08-16 DIAGNOSIS — B3741 Candidal cystitis and urethritis: Secondary | ICD-10-CM | POA: Diagnosis not present

## 2023-08-16 DIAGNOSIS — J441 Chronic obstructive pulmonary disease with (acute) exacerbation: Secondary | ICD-10-CM | POA: Diagnosis not present

## 2023-08-16 DIAGNOSIS — I10 Essential (primary) hypertension: Secondary | ICD-10-CM | POA: Diagnosis not present

## 2023-08-16 DIAGNOSIS — J9621 Acute and chronic respiratory failure with hypoxia: Secondary | ICD-10-CM | POA: Diagnosis not present

## 2023-08-16 DIAGNOSIS — J15212 Pneumonia due to Methicillin resistant Staphylococcus aureus: Secondary | ICD-10-CM | POA: Diagnosis not present

## 2023-08-16 DIAGNOSIS — J440 Chronic obstructive pulmonary disease with acute lower respiratory infection: Secondary | ICD-10-CM | POA: Diagnosis not present

## 2023-08-17 DIAGNOSIS — J440 Chronic obstructive pulmonary disease with acute lower respiratory infection: Secondary | ICD-10-CM | POA: Diagnosis not present

## 2023-08-17 DIAGNOSIS — B3741 Candidal cystitis and urethritis: Secondary | ICD-10-CM | POA: Diagnosis not present

## 2023-08-17 DIAGNOSIS — J441 Chronic obstructive pulmonary disease with (acute) exacerbation: Secondary | ICD-10-CM | POA: Diagnosis not present

## 2023-08-17 DIAGNOSIS — J9621 Acute and chronic respiratory failure with hypoxia: Secondary | ICD-10-CM | POA: Diagnosis not present

## 2023-08-17 DIAGNOSIS — I10 Essential (primary) hypertension: Secondary | ICD-10-CM | POA: Diagnosis not present

## 2023-08-17 DIAGNOSIS — J15212 Pneumonia due to Methicillin resistant Staphylococcus aureus: Secondary | ICD-10-CM | POA: Diagnosis not present

## 2023-08-18 DIAGNOSIS — K5010 Crohn's disease of large intestine without complications: Secondary | ICD-10-CM | POA: Diagnosis not present

## 2023-08-20 DIAGNOSIS — I5032 Chronic diastolic (congestive) heart failure: Secondary | ICD-10-CM | POA: Diagnosis not present

## 2023-08-20 DIAGNOSIS — I358 Other nonrheumatic aortic valve disorders: Secondary | ICD-10-CM | POA: Diagnosis not present

## 2023-08-23 DIAGNOSIS — I10 Essential (primary) hypertension: Secondary | ICD-10-CM | POA: Diagnosis not present

## 2023-08-23 DIAGNOSIS — K5190 Ulcerative colitis, unspecified, without complications: Secondary | ICD-10-CM | POA: Diagnosis not present

## 2023-08-23 DIAGNOSIS — J441 Chronic obstructive pulmonary disease with (acute) exacerbation: Secondary | ICD-10-CM | POA: Diagnosis not present

## 2023-08-23 DIAGNOSIS — J440 Chronic obstructive pulmonary disease with acute lower respiratory infection: Secondary | ICD-10-CM | POA: Diagnosis not present

## 2023-08-23 DIAGNOSIS — J9621 Acute and chronic respiratory failure with hypoxia: Secondary | ICD-10-CM | POA: Diagnosis not present

## 2023-08-23 DIAGNOSIS — E1165 Type 2 diabetes mellitus with hyperglycemia: Secondary | ICD-10-CM | POA: Diagnosis not present

## 2023-08-23 DIAGNOSIS — J15212 Pneumonia due to Methicillin resistant Staphylococcus aureus: Secondary | ICD-10-CM | POA: Diagnosis not present

## 2023-08-23 DIAGNOSIS — J449 Chronic obstructive pulmonary disease, unspecified: Secondary | ICD-10-CM | POA: Diagnosis not present

## 2023-08-23 DIAGNOSIS — B3741 Candidal cystitis and urethritis: Secondary | ICD-10-CM | POA: Diagnosis not present

## 2023-08-24 DIAGNOSIS — B3741 Candidal cystitis and urethritis: Secondary | ICD-10-CM | POA: Diagnosis not present

## 2023-08-24 DIAGNOSIS — I10 Essential (primary) hypertension: Secondary | ICD-10-CM | POA: Diagnosis not present

## 2023-08-24 DIAGNOSIS — J9621 Acute and chronic respiratory failure with hypoxia: Secondary | ICD-10-CM | POA: Diagnosis not present

## 2023-08-24 DIAGNOSIS — J15212 Pneumonia due to Methicillin resistant Staphylococcus aureus: Secondary | ICD-10-CM | POA: Diagnosis not present

## 2023-08-24 DIAGNOSIS — J440 Chronic obstructive pulmonary disease with acute lower respiratory infection: Secondary | ICD-10-CM | POA: Diagnosis not present

## 2023-08-24 DIAGNOSIS — J441 Chronic obstructive pulmonary disease with (acute) exacerbation: Secondary | ICD-10-CM | POA: Diagnosis not present

## 2023-08-26 DIAGNOSIS — B3741 Candidal cystitis and urethritis: Secondary | ICD-10-CM | POA: Diagnosis not present

## 2023-08-26 DIAGNOSIS — J9621 Acute and chronic respiratory failure with hypoxia: Secondary | ICD-10-CM | POA: Diagnosis not present

## 2023-08-26 DIAGNOSIS — I10 Essential (primary) hypertension: Secondary | ICD-10-CM | POA: Diagnosis not present

## 2023-08-26 DIAGNOSIS — J15212 Pneumonia due to Methicillin resistant Staphylococcus aureus: Secondary | ICD-10-CM | POA: Diagnosis not present

## 2023-08-26 DIAGNOSIS — J441 Chronic obstructive pulmonary disease with (acute) exacerbation: Secondary | ICD-10-CM | POA: Diagnosis not present

## 2023-08-26 DIAGNOSIS — J440 Chronic obstructive pulmonary disease with acute lower respiratory infection: Secondary | ICD-10-CM | POA: Diagnosis not present

## 2023-09-01 DIAGNOSIS — B3741 Candidal cystitis and urethritis: Secondary | ICD-10-CM | POA: Diagnosis not present

## 2023-09-01 DIAGNOSIS — J9621 Acute and chronic respiratory failure with hypoxia: Secondary | ICD-10-CM | POA: Diagnosis not present

## 2023-09-01 DIAGNOSIS — I10 Essential (primary) hypertension: Secondary | ICD-10-CM | POA: Diagnosis not present

## 2023-09-01 DIAGNOSIS — J15212 Pneumonia due to Methicillin resistant Staphylococcus aureus: Secondary | ICD-10-CM | POA: Diagnosis not present

## 2023-09-01 DIAGNOSIS — J440 Chronic obstructive pulmonary disease with acute lower respiratory infection: Secondary | ICD-10-CM | POA: Diagnosis not present

## 2023-09-01 DIAGNOSIS — J441 Chronic obstructive pulmonary disease with (acute) exacerbation: Secondary | ICD-10-CM | POA: Diagnosis not present

## 2023-09-02 DIAGNOSIS — B3741 Candidal cystitis and urethritis: Secondary | ICD-10-CM | POA: Diagnosis not present

## 2023-09-02 DIAGNOSIS — J441 Chronic obstructive pulmonary disease with (acute) exacerbation: Secondary | ICD-10-CM | POA: Diagnosis not present

## 2023-09-02 DIAGNOSIS — K2100 Gastro-esophageal reflux disease with esophagitis, without bleeding: Secondary | ICD-10-CM | POA: Diagnosis not present

## 2023-09-02 DIAGNOSIS — J440 Chronic obstructive pulmonary disease with acute lower respiratory infection: Secondary | ICD-10-CM | POA: Diagnosis not present

## 2023-09-02 DIAGNOSIS — R197 Diarrhea, unspecified: Secondary | ICD-10-CM | POA: Diagnosis not present

## 2023-09-02 DIAGNOSIS — I10 Essential (primary) hypertension: Secondary | ICD-10-CM | POA: Diagnosis not present

## 2023-09-02 DIAGNOSIS — J15212 Pneumonia due to Methicillin resistant Staphylococcus aureus: Secondary | ICD-10-CM | POA: Diagnosis not present

## 2023-09-02 DIAGNOSIS — K5010 Crohn's disease of large intestine without complications: Secondary | ICD-10-CM | POA: Diagnosis not present

## 2023-09-02 DIAGNOSIS — J9621 Acute and chronic respiratory failure with hypoxia: Secondary | ICD-10-CM | POA: Diagnosis not present

## 2023-09-03 DIAGNOSIS — Z1329 Encounter for screening for other suspected endocrine disorder: Secondary | ICD-10-CM | POA: Diagnosis not present

## 2023-09-03 DIAGNOSIS — D509 Iron deficiency anemia, unspecified: Secondary | ICD-10-CM | POA: Diagnosis not present

## 2023-09-03 DIAGNOSIS — E1165 Type 2 diabetes mellitus with hyperglycemia: Secondary | ICD-10-CM | POA: Diagnosis not present

## 2023-09-03 DIAGNOSIS — I10 Essential (primary) hypertension: Secondary | ICD-10-CM | POA: Diagnosis not present

## 2023-09-03 DIAGNOSIS — J449 Chronic obstructive pulmonary disease, unspecified: Secondary | ICD-10-CM | POA: Diagnosis not present

## 2023-09-03 DIAGNOSIS — D649 Anemia, unspecified: Secondary | ICD-10-CM | POA: Diagnosis not present

## 2023-09-03 DIAGNOSIS — Z681 Body mass index (BMI) 19 or less, adult: Secondary | ICD-10-CM | POA: Diagnosis not present

## 2023-09-07 DIAGNOSIS — I10 Essential (primary) hypertension: Secondary | ICD-10-CM | POA: Diagnosis not present

## 2023-09-07 DIAGNOSIS — J15212 Pneumonia due to Methicillin resistant Staphylococcus aureus: Secondary | ICD-10-CM | POA: Diagnosis not present

## 2023-09-07 DIAGNOSIS — J9621 Acute and chronic respiratory failure with hypoxia: Secondary | ICD-10-CM | POA: Diagnosis not present

## 2023-09-07 DIAGNOSIS — J440 Chronic obstructive pulmonary disease with acute lower respiratory infection: Secondary | ICD-10-CM | POA: Diagnosis not present

## 2023-09-07 DIAGNOSIS — J441 Chronic obstructive pulmonary disease with (acute) exacerbation: Secondary | ICD-10-CM | POA: Diagnosis not present

## 2023-09-07 DIAGNOSIS — B3741 Candidal cystitis and urethritis: Secondary | ICD-10-CM | POA: Diagnosis not present

## 2023-09-08 DIAGNOSIS — J9621 Acute and chronic respiratory failure with hypoxia: Secondary | ICD-10-CM | POA: Diagnosis not present

## 2023-09-08 DIAGNOSIS — J15212 Pneumonia due to Methicillin resistant Staphylococcus aureus: Secondary | ICD-10-CM | POA: Diagnosis not present

## 2023-09-08 DIAGNOSIS — B3741 Candidal cystitis and urethritis: Secondary | ICD-10-CM | POA: Diagnosis not present

## 2023-09-08 DIAGNOSIS — J441 Chronic obstructive pulmonary disease with (acute) exacerbation: Secondary | ICD-10-CM | POA: Diagnosis not present

## 2023-09-08 DIAGNOSIS — I10 Essential (primary) hypertension: Secondary | ICD-10-CM | POA: Diagnosis not present

## 2023-09-08 DIAGNOSIS — J440 Chronic obstructive pulmonary disease with acute lower respiratory infection: Secondary | ICD-10-CM | POA: Diagnosis not present

## 2023-09-09 DIAGNOSIS — B3741 Candidal cystitis and urethritis: Secondary | ICD-10-CM | POA: Diagnosis not present

## 2023-09-09 DIAGNOSIS — J9621 Acute and chronic respiratory failure with hypoxia: Secondary | ICD-10-CM | POA: Diagnosis not present

## 2023-09-09 DIAGNOSIS — I10 Essential (primary) hypertension: Secondary | ICD-10-CM | POA: Diagnosis not present

## 2023-09-09 DIAGNOSIS — J15212 Pneumonia due to Methicillin resistant Staphylococcus aureus: Secondary | ICD-10-CM | POA: Diagnosis not present

## 2023-09-09 DIAGNOSIS — J441 Chronic obstructive pulmonary disease with (acute) exacerbation: Secondary | ICD-10-CM | POA: Diagnosis not present

## 2023-09-09 DIAGNOSIS — J440 Chronic obstructive pulmonary disease with acute lower respiratory infection: Secondary | ICD-10-CM | POA: Diagnosis not present

## 2023-09-11 DIAGNOSIS — J9621 Acute and chronic respiratory failure with hypoxia: Secondary | ICD-10-CM | POA: Diagnosis not present

## 2023-09-11 DIAGNOSIS — J441 Chronic obstructive pulmonary disease with (acute) exacerbation: Secondary | ICD-10-CM | POA: Diagnosis not present

## 2023-09-11 DIAGNOSIS — I10 Essential (primary) hypertension: Secondary | ICD-10-CM | POA: Diagnosis not present

## 2023-09-11 DIAGNOSIS — B3741 Candidal cystitis and urethritis: Secondary | ICD-10-CM | POA: Diagnosis not present

## 2023-09-11 DIAGNOSIS — J440 Chronic obstructive pulmonary disease with acute lower respiratory infection: Secondary | ICD-10-CM | POA: Diagnosis not present

## 2023-09-11 DIAGNOSIS — J15212 Pneumonia due to Methicillin resistant Staphylococcus aureus: Secondary | ICD-10-CM | POA: Diagnosis not present

## 2023-09-22 DIAGNOSIS — K5190 Ulcerative colitis, unspecified, without complications: Secondary | ICD-10-CM | POA: Diagnosis not present

## 2023-09-22 DIAGNOSIS — J449 Chronic obstructive pulmonary disease, unspecified: Secondary | ICD-10-CM | POA: Diagnosis not present

## 2023-09-22 DIAGNOSIS — I10 Essential (primary) hypertension: Secondary | ICD-10-CM | POA: Diagnosis not present

## 2023-09-22 DIAGNOSIS — E1165 Type 2 diabetes mellitus with hyperglycemia: Secondary | ICD-10-CM | POA: Diagnosis not present

## 2023-09-24 DIAGNOSIS — Z87891 Personal history of nicotine dependence: Secondary | ICD-10-CM | POA: Diagnosis not present

## 2023-09-24 DIAGNOSIS — J449 Chronic obstructive pulmonary disease, unspecified: Secondary | ICD-10-CM | POA: Diagnosis not present

## 2023-09-24 DIAGNOSIS — R911 Solitary pulmonary nodule: Secondary | ICD-10-CM | POA: Diagnosis not present

## 2023-10-04 DIAGNOSIS — I10 Essential (primary) hypertension: Secondary | ICD-10-CM | POA: Diagnosis not present

## 2023-10-07 DIAGNOSIS — C3412 Malignant neoplasm of upper lobe, left bronchus or lung: Secondary | ICD-10-CM | POA: Diagnosis not present

## 2023-10-07 DIAGNOSIS — Z79899 Other long term (current) drug therapy: Secondary | ICD-10-CM | POA: Diagnosis not present

## 2023-10-07 DIAGNOSIS — Z9981 Dependence on supplemental oxygen: Secondary | ICD-10-CM | POA: Diagnosis not present

## 2023-10-07 DIAGNOSIS — Z7902 Long term (current) use of antithrombotics/antiplatelets: Secondary | ICD-10-CM | POA: Diagnosis not present

## 2023-10-07 DIAGNOSIS — D472 Monoclonal gammopathy: Secondary | ICD-10-CM | POA: Diagnosis not present

## 2023-10-07 DIAGNOSIS — Z7901 Long term (current) use of anticoagulants: Secondary | ICD-10-CM | POA: Diagnosis not present

## 2023-10-07 DIAGNOSIS — D649 Anemia, unspecified: Secondary | ICD-10-CM | POA: Diagnosis not present

## 2023-10-07 DIAGNOSIS — Z87891 Personal history of nicotine dependence: Secondary | ICD-10-CM | POA: Diagnosis not present

## 2023-10-07 DIAGNOSIS — K8020 Calculus of gallbladder without cholecystitis without obstruction: Secondary | ICD-10-CM | POA: Diagnosis not present

## 2023-10-07 DIAGNOSIS — R918 Other nonspecific abnormal finding of lung field: Secondary | ICD-10-CM | POA: Diagnosis not present

## 2023-10-07 DIAGNOSIS — Z7983 Long term (current) use of bisphosphonates: Secondary | ICD-10-CM | POA: Diagnosis not present

## 2023-10-07 DIAGNOSIS — Z7951 Long term (current) use of inhaled steroids: Secondary | ICD-10-CM | POA: Diagnosis not present

## 2023-10-13 DIAGNOSIS — K5010 Crohn's disease of large intestine without complications: Secondary | ICD-10-CM | POA: Diagnosis not present

## 2023-10-15 DIAGNOSIS — C3492 Malignant neoplasm of unspecified part of left bronchus or lung: Secondary | ICD-10-CM | POA: Diagnosis not present

## 2023-10-22 DIAGNOSIS — J449 Chronic obstructive pulmonary disease, unspecified: Secondary | ICD-10-CM | POA: Diagnosis not present

## 2023-10-22 DIAGNOSIS — K5190 Ulcerative colitis, unspecified, without complications: Secondary | ICD-10-CM | POA: Diagnosis not present

## 2023-10-22 DIAGNOSIS — C44622 Squamous cell carcinoma of skin of right upper limb, including shoulder: Secondary | ICD-10-CM | POA: Diagnosis not present

## 2023-10-22 DIAGNOSIS — I10 Essential (primary) hypertension: Secondary | ICD-10-CM | POA: Diagnosis not present

## 2023-10-22 DIAGNOSIS — E1165 Type 2 diabetes mellitus with hyperglycemia: Secondary | ICD-10-CM | POA: Diagnosis not present

## 2023-11-11 DIAGNOSIS — C61 Malignant neoplasm of prostate: Secondary | ICD-10-CM | POA: Diagnosis not present

## 2023-11-19 DIAGNOSIS — M79674 Pain in right toe(s): Secondary | ICD-10-CM | POA: Diagnosis not present

## 2023-11-19 DIAGNOSIS — M79675 Pain in left toe(s): Secondary | ICD-10-CM | POA: Diagnosis not present

## 2023-11-19 DIAGNOSIS — B351 Tinea unguium: Secondary | ICD-10-CM | POA: Diagnosis not present

## 2023-11-22 DIAGNOSIS — E1165 Type 2 diabetes mellitus with hyperglycemia: Secondary | ICD-10-CM | POA: Diagnosis not present

## 2023-11-22 DIAGNOSIS — K5190 Ulcerative colitis, unspecified, without complications: Secondary | ICD-10-CM | POA: Diagnosis not present

## 2023-11-22 DIAGNOSIS — J449 Chronic obstructive pulmonary disease, unspecified: Secondary | ICD-10-CM | POA: Diagnosis not present

## 2023-11-22 DIAGNOSIS — I10 Essential (primary) hypertension: Secondary | ICD-10-CM | POA: Diagnosis not present

## 2023-12-06 DIAGNOSIS — R079 Chest pain, unspecified: Secondary | ICD-10-CM | POA: Diagnosis not present

## 2023-12-06 DIAGNOSIS — N390 Urinary tract infection, site not specified: Secondary | ICD-10-CM | POA: Diagnosis not present

## 2023-12-13 DIAGNOSIS — N390 Urinary tract infection, site not specified: Secondary | ICD-10-CM | POA: Diagnosis not present

## 2023-12-13 DIAGNOSIS — R3129 Other microscopic hematuria: Secondary | ICD-10-CM | POA: Diagnosis not present

## 2023-12-13 DIAGNOSIS — B3749 Other urogenital candidiasis: Secondary | ICD-10-CM | POA: Diagnosis not present

## 2023-12-13 DIAGNOSIS — N401 Enlarged prostate with lower urinary tract symptoms: Secondary | ICD-10-CM | POA: Diagnosis not present

## 2023-12-13 DIAGNOSIS — Z8546 Personal history of malignant neoplasm of prostate: Secondary | ICD-10-CM | POA: Diagnosis not present

## 2023-12-20 DIAGNOSIS — K5010 Crohn's disease of large intestine without complications: Secondary | ICD-10-CM | POA: Diagnosis not present

## 2023-12-23 DIAGNOSIS — I10 Essential (primary) hypertension: Secondary | ICD-10-CM | POA: Diagnosis not present

## 2023-12-23 DIAGNOSIS — E1165 Type 2 diabetes mellitus with hyperglycemia: Secondary | ICD-10-CM | POA: Diagnosis not present

## 2023-12-23 DIAGNOSIS — K5190 Ulcerative colitis, unspecified, without complications: Secondary | ICD-10-CM | POA: Diagnosis not present

## 2023-12-23 DIAGNOSIS — J449 Chronic obstructive pulmonary disease, unspecified: Secondary | ICD-10-CM | POA: Diagnosis not present

## 2023-12-24 DIAGNOSIS — Z87891 Personal history of nicotine dependence: Secondary | ICD-10-CM | POA: Diagnosis not present

## 2023-12-24 DIAGNOSIS — K5090 Crohn's disease, unspecified, without complications: Secondary | ICD-10-CM | POA: Diagnosis not present

## 2023-12-24 DIAGNOSIS — J479 Bronchiectasis, uncomplicated: Secondary | ICD-10-CM | POA: Diagnosis not present

## 2023-12-24 DIAGNOSIS — Z7951 Long term (current) use of inhaled steroids: Secondary | ICD-10-CM | POA: Diagnosis not present

## 2023-12-24 DIAGNOSIS — Z7902 Long term (current) use of antithrombotics/antiplatelets: Secondary | ICD-10-CM | POA: Diagnosis not present

## 2023-12-24 DIAGNOSIS — Z79899 Other long term (current) drug therapy: Secondary | ICD-10-CM | POA: Diagnosis not present

## 2023-12-24 DIAGNOSIS — D472 Monoclonal gammopathy: Secondary | ICD-10-CM | POA: Diagnosis not present

## 2023-12-24 DIAGNOSIS — K8020 Calculus of gallbladder without cholecystitis without obstruction: Secondary | ICD-10-CM | POA: Diagnosis not present

## 2023-12-24 DIAGNOSIS — J439 Emphysema, unspecified: Secondary | ICD-10-CM | POA: Diagnosis not present

## 2023-12-24 DIAGNOSIS — Z9981 Dependence on supplemental oxygen: Secondary | ICD-10-CM | POA: Diagnosis not present

## 2023-12-24 DIAGNOSIS — D509 Iron deficiency anemia, unspecified: Secondary | ICD-10-CM | POA: Diagnosis not present

## 2023-12-24 DIAGNOSIS — C3492 Malignant neoplasm of unspecified part of left bronchus or lung: Secondary | ICD-10-CM | POA: Diagnosis not present

## 2023-12-24 DIAGNOSIS — Z7983 Long term (current) use of bisphosphonates: Secondary | ICD-10-CM | POA: Diagnosis not present

## 2023-12-24 DIAGNOSIS — R771 Abnormality of globulin: Secondary | ICD-10-CM | POA: Diagnosis not present

## 2023-12-30 DIAGNOSIS — R911 Solitary pulmonary nodule: Secondary | ICD-10-CM | POA: Diagnosis not present

## 2023-12-30 DIAGNOSIS — Z87891 Personal history of nicotine dependence: Secondary | ICD-10-CM | POA: Diagnosis not present

## 2023-12-30 DIAGNOSIS — J449 Chronic obstructive pulmonary disease, unspecified: Secondary | ICD-10-CM | POA: Diagnosis not present

## 2023-12-31 DIAGNOSIS — C3492 Malignant neoplasm of unspecified part of left bronchus or lung: Secondary | ICD-10-CM | POA: Diagnosis not present

## 2024-01-03 DIAGNOSIS — K2100 Gastro-esophageal reflux disease with esophagitis, without bleeding: Secondary | ICD-10-CM | POA: Diagnosis not present

## 2024-01-03 DIAGNOSIS — K5010 Crohn's disease of large intestine without complications: Secondary | ICD-10-CM | POA: Diagnosis not present

## 2024-01-03 DIAGNOSIS — R197 Diarrhea, unspecified: Secondary | ICD-10-CM | POA: Diagnosis not present

## 2024-01-07 DIAGNOSIS — K5010 Crohn's disease of large intestine without complications: Secondary | ICD-10-CM | POA: Diagnosis not present

## 2024-01-21 DIAGNOSIS — E1165 Type 2 diabetes mellitus with hyperglycemia: Secondary | ICD-10-CM | POA: Diagnosis not present

## 2024-01-21 DIAGNOSIS — K5190 Ulcerative colitis, unspecified, without complications: Secondary | ICD-10-CM | POA: Diagnosis not present

## 2024-01-21 DIAGNOSIS — I10 Essential (primary) hypertension: Secondary | ICD-10-CM | POA: Diagnosis not present

## 2024-01-21 DIAGNOSIS — J449 Chronic obstructive pulmonary disease, unspecified: Secondary | ICD-10-CM | POA: Diagnosis not present

## 2024-02-11 DIAGNOSIS — M79674 Pain in right toe(s): Secondary | ICD-10-CM | POA: Diagnosis not present

## 2024-02-11 DIAGNOSIS — M79675 Pain in left toe(s): Secondary | ICD-10-CM | POA: Diagnosis not present

## 2024-02-11 DIAGNOSIS — B351 Tinea unguium: Secondary | ICD-10-CM | POA: Diagnosis not present

## 2024-02-14 DIAGNOSIS — K5010 Crohn's disease of large intestine without complications: Secondary | ICD-10-CM | POA: Diagnosis not present

## 2024-02-22 DIAGNOSIS — I10 Essential (primary) hypertension: Secondary | ICD-10-CM | POA: Diagnosis not present

## 2024-02-22 DIAGNOSIS — K5190 Ulcerative colitis, unspecified, without complications: Secondary | ICD-10-CM | POA: Diagnosis not present

## 2024-02-22 DIAGNOSIS — E1165 Type 2 diabetes mellitus with hyperglycemia: Secondary | ICD-10-CM | POA: Diagnosis not present

## 2024-02-22 DIAGNOSIS — J449 Chronic obstructive pulmonary disease, unspecified: Secondary | ICD-10-CM | POA: Diagnosis not present

## 2024-03-02 DIAGNOSIS — L814 Other melanin hyperpigmentation: Secondary | ICD-10-CM | POA: Diagnosis not present

## 2024-03-02 DIAGNOSIS — L570 Actinic keratosis: Secondary | ICD-10-CM | POA: Diagnosis not present

## 2024-03-02 DIAGNOSIS — L820 Inflamed seborrheic keratosis: Secondary | ICD-10-CM | POA: Diagnosis not present

## 2024-03-09 DIAGNOSIS — E7849 Other hyperlipidemia: Secondary | ICD-10-CM | POA: Diagnosis not present

## 2024-03-09 DIAGNOSIS — E1165 Type 2 diabetes mellitus with hyperglycemia: Secondary | ICD-10-CM | POA: Diagnosis not present

## 2024-03-09 DIAGNOSIS — K219 Gastro-esophageal reflux disease without esophagitis: Secondary | ICD-10-CM | POA: Diagnosis not present

## 2024-03-09 DIAGNOSIS — Z23 Encounter for immunization: Secondary | ICD-10-CM | POA: Diagnosis not present

## 2024-03-09 DIAGNOSIS — K5190 Ulcerative colitis, unspecified, without complications: Secondary | ICD-10-CM | POA: Diagnosis not present

## 2024-03-09 DIAGNOSIS — E559 Vitamin D deficiency, unspecified: Secondary | ICD-10-CM | POA: Diagnosis not present

## 2024-03-09 DIAGNOSIS — Z1321 Encounter for screening for nutritional disorder: Secondary | ICD-10-CM | POA: Diagnosis not present

## 2024-03-09 DIAGNOSIS — D559 Anemia due to enzyme disorder, unspecified: Secondary | ICD-10-CM | POA: Diagnosis not present

## 2024-03-09 DIAGNOSIS — I10 Essential (primary) hypertension: Secondary | ICD-10-CM | POA: Diagnosis not present

## 2024-03-09 DIAGNOSIS — Z0001 Encounter for general adult medical examination with abnormal findings: Secondary | ICD-10-CM | POA: Diagnosis not present

## 2024-03-09 DIAGNOSIS — Z6820 Body mass index (BMI) 20.0-20.9, adult: Secondary | ICD-10-CM | POA: Diagnosis not present

## 2024-03-09 DIAGNOSIS — Z2911 Encounter for prophylactic immunotherapy for respiratory syncytial virus (RSV): Secondary | ICD-10-CM | POA: Diagnosis not present

## 2024-03-17 DIAGNOSIS — C3490 Malignant neoplasm of unspecified part of unspecified bronchus or lung: Secondary | ICD-10-CM | POA: Diagnosis not present

## 2024-03-17 DIAGNOSIS — Z8546 Personal history of malignant neoplasm of prostate: Secondary | ICD-10-CM | POA: Diagnosis not present

## 2024-03-17 DIAGNOSIS — K5090 Crohn's disease, unspecified, without complications: Secondary | ICD-10-CM | POA: Diagnosis not present

## 2024-03-17 DIAGNOSIS — J439 Emphysema, unspecified: Secondary | ICD-10-CM | POA: Diagnosis not present

## 2024-03-17 DIAGNOSIS — Z79899 Other long term (current) drug therapy: Secondary | ICD-10-CM | POA: Diagnosis not present

## 2024-03-17 DIAGNOSIS — K219 Gastro-esophageal reflux disease without esophagitis: Secondary | ICD-10-CM | POA: Diagnosis not present

## 2024-03-17 DIAGNOSIS — D500 Iron deficiency anemia secondary to blood loss (chronic): Secondary | ICD-10-CM | POA: Diagnosis not present

## 2024-03-17 DIAGNOSIS — Z85820 Personal history of malignant melanoma of skin: Secondary | ICD-10-CM | POA: Diagnosis not present

## 2024-03-17 DIAGNOSIS — Z87891 Personal history of nicotine dependence: Secondary | ICD-10-CM | POA: Diagnosis not present

## 2024-03-17 DIAGNOSIS — Z8616 Personal history of COVID-19: Secondary | ICD-10-CM | POA: Diagnosis not present

## 2024-03-17 DIAGNOSIS — Z7902 Long term (current) use of antithrombotics/antiplatelets: Secondary | ICD-10-CM | POA: Diagnosis not present

## 2024-03-17 DIAGNOSIS — K8020 Calculus of gallbladder without cholecystitis without obstruction: Secondary | ICD-10-CM | POA: Diagnosis not present

## 2024-03-17 DIAGNOSIS — Z7951 Long term (current) use of inhaled steroids: Secondary | ICD-10-CM | POA: Diagnosis not present

## 2024-03-17 DIAGNOSIS — Z85118 Personal history of other malignant neoplasm of bronchus and lung: Secondary | ICD-10-CM | POA: Diagnosis not present

## 2024-03-17 DIAGNOSIS — Z923 Personal history of irradiation: Secondary | ICD-10-CM | POA: Diagnosis not present

## 2024-03-17 DIAGNOSIS — I10 Essential (primary) hypertension: Secondary | ICD-10-CM | POA: Diagnosis not present

## 2024-03-22 DIAGNOSIS — R000 Tachycardia, unspecified: Secondary | ICD-10-CM | POA: Diagnosis not present

## 2024-03-24 DIAGNOSIS — C3492 Malignant neoplasm of unspecified part of left bronchus or lung: Secondary | ICD-10-CM | POA: Diagnosis not present

## 2024-03-24 DIAGNOSIS — K5190 Ulcerative colitis, unspecified, without complications: Secondary | ICD-10-CM | POA: Diagnosis not present

## 2024-03-24 DIAGNOSIS — I10 Essential (primary) hypertension: Secondary | ICD-10-CM | POA: Diagnosis not present

## 2024-03-24 DIAGNOSIS — J449 Chronic obstructive pulmonary disease, unspecified: Secondary | ICD-10-CM | POA: Diagnosis not present

## 2024-03-24 DIAGNOSIS — E1165 Type 2 diabetes mellitus with hyperglycemia: Secondary | ICD-10-CM | POA: Diagnosis not present

## 2024-03-25 DIAGNOSIS — I4729 Other ventricular tachycardia: Secondary | ICD-10-CM | POA: Diagnosis not present

## 2024-03-25 DIAGNOSIS — I471 Supraventricular tachycardia, unspecified: Secondary | ICD-10-CM | POA: Diagnosis not present

## 2024-04-10 DIAGNOSIS — K5010 Crohn's disease of large intestine without complications: Secondary | ICD-10-CM | POA: Diagnosis not present

## 2024-04-21 DIAGNOSIS — K5190 Ulcerative colitis, unspecified, without complications: Secondary | ICD-10-CM | POA: Diagnosis not present

## 2024-04-21 DIAGNOSIS — E1165 Type 2 diabetes mellitus with hyperglycemia: Secondary | ICD-10-CM | POA: Diagnosis not present

## 2024-04-21 DIAGNOSIS — I10 Essential (primary) hypertension: Secondary | ICD-10-CM | POA: Diagnosis not present

## 2024-04-21 DIAGNOSIS — J449 Chronic obstructive pulmonary disease, unspecified: Secondary | ICD-10-CM | POA: Diagnosis not present

## 2024-05-05 DIAGNOSIS — B351 Tinea unguium: Secondary | ICD-10-CM | POA: Diagnosis not present

## 2024-05-05 DIAGNOSIS — M79674 Pain in right toe(s): Secondary | ICD-10-CM | POA: Diagnosis not present

## 2024-05-05 DIAGNOSIS — M79675 Pain in left toe(s): Secondary | ICD-10-CM | POA: Diagnosis not present

## 2024-05-10 DIAGNOSIS — K5010 Crohn's disease of large intestine without complications: Secondary | ICD-10-CM | POA: Diagnosis not present
